# Patient Record
Sex: Female | Born: 1940 | Race: White | Hispanic: No | Smoking: Former smoker
Health system: Southern US, Community
[De-identification: ages and names within clinical notes are randomized; demographics above are authoritative.]

## PROBLEM LIST (undated history)

## (undated) ENCOUNTER — Ambulatory Visit: Admission: EM | Payer: 59 | Source: Home / Self Care

## (undated) DIAGNOSIS — F329 Major depressive disorder, single episode, unspecified: Secondary | ICD-10-CM

## (undated) DIAGNOSIS — N3281 Overactive bladder: Secondary | ICD-10-CM

## (undated) DIAGNOSIS — M1990 Unspecified osteoarthritis, unspecified site: Secondary | ICD-10-CM

## (undated) DIAGNOSIS — N390 Urinary tract infection, site not specified: Secondary | ICD-10-CM

## (undated) DIAGNOSIS — F419 Anxiety disorder, unspecified: Secondary | ICD-10-CM

## (undated) DIAGNOSIS — J45909 Unspecified asthma, uncomplicated: Secondary | ICD-10-CM

## (undated) DIAGNOSIS — F32A Depression, unspecified: Secondary | ICD-10-CM

## (undated) DIAGNOSIS — J449 Chronic obstructive pulmonary disease, unspecified: Secondary | ICD-10-CM

## (undated) DIAGNOSIS — G4700 Insomnia, unspecified: Secondary | ICD-10-CM

## (undated) DIAGNOSIS — L4050 Arthropathic psoriasis, unspecified: Secondary | ICD-10-CM

## (undated) HISTORY — PX: TONSILLECTOMY: SUR1361

## (undated) HISTORY — PX: ABDOMINAL HYSTERECTOMY: SHX81

## (undated) HISTORY — DX: Arthropathic psoriasis, unspecified: L40.50

## (undated) HISTORY — PX: KNEE SURGERY: SHX244

---

## 2009-02-10 ENCOUNTER — Ambulatory Visit: Payer: Self-pay | Admitting: Occupational Medicine

## 2009-07-10 ENCOUNTER — Ambulatory Visit: Payer: Self-pay | Admitting: Family Medicine

## 2012-11-02 ENCOUNTER — Emergency Department (INDEPENDENT_AMBULATORY_CARE_PROVIDER_SITE_OTHER): Payer: Medicare HMO

## 2012-11-02 ENCOUNTER — Emergency Department
Admission: EM | Admit: 2012-11-02 | Discharge: 2012-11-02 | Disposition: A | Discharge status: Home / Self Care | Payer: Medicare HMO | Source: Home / Self Care | Attending: Family Medicine | Admitting: Family Medicine

## 2012-11-02 DIAGNOSIS — N390 Urinary tract infection, site not specified: Secondary | ICD-10-CM

## 2012-11-02 DIAGNOSIS — R0609 Other forms of dyspnea: Secondary | ICD-10-CM

## 2012-11-02 DIAGNOSIS — R0902 Hypoxemia: Secondary | ICD-10-CM

## 2012-11-02 DIAGNOSIS — J45909 Unspecified asthma, uncomplicated: Secondary | ICD-10-CM

## 2012-11-02 DIAGNOSIS — J45901 Unspecified asthma with (acute) exacerbation: Secondary | ICD-10-CM

## 2012-11-02 HISTORY — DX: Unspecified asthma, uncomplicated: J45.909

## 2012-11-02 HISTORY — DX: Insomnia, unspecified: G47.00

## 2012-11-02 HISTORY — DX: Depression, unspecified: F32.A

## 2012-11-02 HISTORY — DX: Anxiety disorder, unspecified: F41.9

## 2012-11-02 HISTORY — DX: Major depressive disorder, single episode, unspecified: F32.9

## 2012-11-02 HISTORY — DX: Unspecified osteoarthritis, unspecified site: M19.90

## 2012-11-02 LAB — POCT URINALYSIS DIP (MANUAL ENTRY)
Glucose, UA: NEGATIVE
Protein Ur, POC: NEGATIVE
Spec Grav, UA: 1.02 (ref 1.005–1.03)
Urobilinogen, UA: 0.2 (ref 0–1)

## 2012-11-02 MED ORDER — METHYLPREDNISOLONE SODIUM SUCC 125 MG IJ SOLR
125.0000 mg | Freq: Once | INTRAMUSCULAR | Status: AC
  Administered 2012-11-02: 125 mg via INTRAMUSCULAR

## 2012-11-02 MED ORDER — IPRATROPIUM-ALBUTEROL 0.5-2.5 (3) MG/3ML IN SOLN
3.0000 mL | RESPIRATORY_TRACT | Status: DC
  Administered 2012-11-02 (×2): 3 mL via RESPIRATORY_TRACT

## 2012-11-02 MED ORDER — PREDNISONE 50 MG PO TABS: ORAL_TABLET | ORAL | Status: DC

## 2012-11-02 MED ORDER — LEVOFLOXACIN 500 MG PO TABS: 500.0000 mg | ORAL_TABLET | Freq: Every day | ORAL | Status: DC

## 2012-11-02 NOTE — ED Notes (Signed)
Symptoms started two days ago w/  Increased frequency

## 2012-11-02 NOTE — ED Provider Notes (Signed)
History     CSN: [id number]  Arrival date & time 11/02/12  1044   First MD Initiated Contact with Patient 11/02/12 1047      Chief Complaint  Patient presents with  . Dysuria   HPI DYSURIA Onset:   2 days  Description: increased urinary frequency and urgency. Similar to UTIs in the past.  Modifying factors: baseline asthmatic. Working to breath currently.   Symptoms Urgency:  yes Frequency: yes  Hesitancy:  yes Hematuria:  no Flank Pain:  no Fever: no Nausea/Vomiting:  no Missed LMP: no STD exposure: no Discharge: no Irritants: no Rash: no  Red Flags   More than 3 UTI's last 12 months:  no PMH of  Diabetes or Immunosuppression:  no Renal Disease/Calculi: no Urinary Tract Abnormality:  no Instrumentation or Trauma: no  Pt also with increased WOB. Pt states that this is a persistent issue. On advair 250/50 for asthma. Non smoker. Has had some mild rhinorrhea, otherwise, no URI sxs.    Past Medical History  Diagnosis Date  . Asthma   . Arthritis   . Insomnia   . Anxiety   . Depression     Past Surgical History  Procedure Date  . Tonsillectomy   . Abdominal hysterectomy   . Knee surgery     History reviewed. No pertinent family history.  History  Substance Use Topics  . Smoking status: Never Smoker   . Smokeless tobacco: Not on file  . Alcohol Use: No    OB History    Grav Para Term Preterm Abortions TAB SAB Ect Mult Living                  Review of Systems  All other systems reviewed and are negative.    Allergies  Chantix and Codeine  Home Medications   Current Outpatient Rx  Name  Route  Sig  Dispense  Refill  . ALPRAZOLAM 0.5 MG PO TABS   Oral   Take 0.5 mg by mouth at bedtime as needed.         Marland Kitchen HYDROCODONE-ACETAMINOPHEN 5-325 MG PO TABS   Oral   Take 1 tablet by mouth every 6 (six) hours as needed.         Marland Kitchen METOCLOPRAMIDE HCL 10 MG PO TABS   Oral   Take 10 mg by mouth 4 (four) times daily.         . TRAZODONE  HCL 150 MG PO TABS   Oral   Take 150 mg by mouth at bedtime.           BP 131/85  Pulse 97  Temp 98.3 F (36.8 C) (Oral)  Resp 24  Ht 5\' 2"  (1.575 m)  Wt 200 lb (90.719 kg)  BMI 36.58 kg/m2  SpO2 92%  Physical Exam  Constitutional:       In chair, increased WOB, unable to speak in full sentences    HENT:  Head: Normocephalic and atraumatic.  Right Ear: External ear normal.  Left Ear: External ear normal.  Eyes: Conjunctivae normal are normal. Pupils are equal, round, and reactive to light.  Neck: Normal range of motion. Neck supple.  Cardiovascular: Normal rate, regular rhythm and normal heart sounds.   Pulmonary/Chest:       Increased WOB, diffuse expiratory wheezes    Abdominal: Soft.       No flank pain  + suprapubic tenderness    Musculoskeletal: Normal range of motion.  Neurological: She is alert.  Skin: Skin is warm.    ED Course  Procedures (including critical care time)  Labs Reviewed - No data to display Dg Chest 2 View  11/02/2012  *RADIOLOGY REPORT*  Clinical Data: Difficulty breathing, asthma, hypoxia  CHEST - 2 VIEW  Comparison: 02/10/2009  Findings: Normal heart size and vascularity.  Scoliosis of the spine noted which distorts the right hilar contour as before.  No focal pneumonia, collapse, consolidation, edema, effusion, or pneumothorax.  Stable sub centimeter calcified granuloma in the left upper lobe.  Healed posterior left fourth rib fracture as before.  Postop changes at the epigastric region.  No significant interval change.  IMPRESSION: Stable chest exam.  No acute process.   Original Report Authenticated By: Judie Petit. Shick, M.D.      1. UTI (lower urinary tract infection)   2. Asthma exacerbation       MDM  Concominant UTI and asthma exacerbation.  Improved aeration s/p duoneb treatment. O2 sats 92%-->98%. Solumedrol 125mg   IM x1  Prednisone x 7 days Will place on levaquin for urinary and resp coverage.  Urine culture.  Infectious,  urinary, and resp red flags discussed at length with pt. Otherwise, follow up as needed.     The patient and/or caregiver has been counseled thoroughly with regard to treatment plan and/or medications prescribed including dosage, schedule, interactions, rationale for use, and possible side effects and they verbalize understanding. Diagnoses and expected course of recovery discussed and will return if not improved as expected or if the condition worsens. Patient and/or caregiver verbalized understanding.             Doree Albee, MD 11/02/12 1225

## 2012-11-03 LAB — URINE CULTURE: Colony Count: 4000

## 2012-11-05 ENCOUNTER — Telehealth: Payer: Self-pay | Admitting: *Deleted

## 2013-04-21 DIAGNOSIS — M810 Age-related osteoporosis without current pathological fracture: Secondary | ICD-10-CM | POA: Insufficient documentation

## 2014-07-10 DIAGNOSIS — Z9889 Other specified postprocedural states: Secondary | ICD-10-CM | POA: Insufficient documentation

## 2017-02-26 DIAGNOSIS — J449 Chronic obstructive pulmonary disease, unspecified: Secondary | ICD-10-CM | POA: Insufficient documentation

## 2017-10-23 HISTORY — PX: BELOW KNEE LEG AMPUTATION: SUR23

## 2018-03-15 ENCOUNTER — Encounter: Payer: Self-pay | Admitting: Emergency Medicine

## 2018-03-15 ENCOUNTER — Emergency Department
Admission: EM | Admit: 2018-03-15 | Discharge: 2018-03-15 | Disposition: A | Discharge status: Home / Self Care | Payer: Medicare HMO | Source: Home / Self Care

## 2018-03-15 DIAGNOSIS — J441 Chronic obstructive pulmonary disease with (acute) exacerbation: Secondary | ICD-10-CM

## 2018-03-15 HISTORY — DX: Chronic obstructive pulmonary disease, unspecified: J44.9

## 2018-03-15 MED ORDER — PREDNISONE 50 MG PO TABS: ORAL_TABLET | ORAL | 0 refills | Status: DC

## 2018-03-15 MED ORDER — METHYLPREDNISOLONE ACETATE 80 MG/ML IJ SUSP
80.0000 mg | Freq: Once | INTRAMUSCULAR | Status: AC
  Administered 2018-03-15: 80 mg via INTRAMUSCULAR

## 2018-03-15 MED ORDER — LEVOFLOXACIN 500 MG PO TABS: 500.0000 mg | ORAL_TABLET | Freq: Every day | ORAL | 0 refills | Status: DC

## 2018-03-15 MED ORDER — IPRATROPIUM-ALBUTEROL 0.5-2.5 (3) MG/3ML IN SOLN
3.0000 mL | Freq: Once | RESPIRATORY_TRACT | Status: AC
  Administered 2018-03-15: 3 mL via RESPIRATORY_TRACT

## 2018-03-15 NOTE — Discharge Instructions (Addendum)
I have sent a prescription to your pharmacy for an antibiotic that you have taken in the past as well as prednisone which is was also taken in the past.  Each of these new prescriptions are to be taken once daily with food.  I am expecting that you will feel better in the next 24 hours.  Continue using your albuterol inhaler every 6 hours as needed.  If you are not improving, please return or go to the emergency room.  I recommend that you follow-up with your personal doctor in 1 week to determine if you need to stay on medications that we prescribed today.

## 2018-03-15 NOTE — ED Provider Notes (Signed)
[facility]    CSN: [id number] Arrival date & time: 03/15/18  1441     History   Chief Complaint Chief Complaint  Patient presents with  . Cough    HPI Alexandra Banks is a 77 y.o. female.   She comes in with a 4-day history of shortness of breath and wheezing.  She is a known COPD patient who quit smoking in 2011.  She does not use home oxygen but does rely on an albuterol inhaler.  She had tried a trilogy inhaler but had adverse effects from this and stopped using it.  She is also taking methotrexate for her psoriasis.  Patient reports that she has never had an exacerbation like this before.  She has had pneumonia in the past however.  She reports no fever, chest pain, nausea or vomiting, or otalgia.  She has had some sinus congestion.  Patient states that the cough is occasionally productive.  She does have some difficulty getting to sleep but once asleep she is able to state comfortable.  Patient's room mate drove her to this facility today  Past Medical History:  Diagnosis Date  . Anxiety   . Arthritis   . Asthma   . COPD (chronic obstructive pulmonary disease) (HCC)   . Depression   . Insomnia     There are no active problems to display for this patient.   Past Surgical History:  Procedure Laterality Date  . ABDOMINAL HYSTERECTOMY    . KNEE SURGERY    . TONSILLECTOMY      OB History   None      Home Medications    Prior to Admission medications   Medication Sig Start Date End Date Taking? Authorizing Provider  ALPRAZolam Prudy Feeler) 0.5 MG tablet Take 0.5 mg by mouth at bedtime as needed.    [provider]  HYDROcodone-acetaminophen (NORCO/VICODIN) 5-325 MG per tablet Take 1 tablet by mouth every 6 (six) hours as needed.    [provider]  levofloxacin (LEVAQUIN) 500 MG tablet Take 1 tablet (500 mg total) by mouth daily. 03/15/18   Elvina Sidle, MD  metoCLOPramide (REGLAN) 10 MG tablet Take 10 mg by mouth 4 (four) times  daily.    [provider]  predniSONE (DELTASONE) 50 MG tablet 1 tab daily x 7 days 03/15/18   Elvina Sidle, MD  traZODone (DESYREL) 150 MG tablet Take 150 mg by mouth at bedtime.    [provider]    Family History History reviewed. No pertinent family history.  Social History Social History   Tobacco Use  . Smoking status: Former Smoker    Types: Cigarettes    Last attempt to quit: 2011    Years since quitting: 8.3  . Smokeless tobacco: Never Used  Substance Use Topics  . Alcohol use: No  . Drug use: No     Allergies   Chantix [varenicline] and Codeine   Review of Systems Review of Systems   Physical Exam Triage Vital Signs ED Triage Vitals  Enc Vitals Group     BP 03/15/18 1505 (!) 141/91     Pulse Rate 03/15/18 1505 68     Resp --      Temp 03/15/18 1505 98.3 F (36.8 C)     Temp Source 03/15/18 1505 Oral     SpO2 03/15/18 1505 95 %     Weight 03/15/18 1506 200 lb (90.7 kg)     Height --      Head Circumference --  Peak Flow --      Pain Score 03/15/18 1506 0     Pain Loc --      Pain Edu? --      Excl. in GC? --    No data found.  Updated Vital Signs BP (!) 141/91   Pulse 68   Temp 98.3 F (36.8 C) (Oral)   Wt 200 lb (90.7 kg)   SpO2 95%   BMI 36.58 kg/m      Physical Exam This is an elderly woman in obvious mild respiratory distress with audible wheezing.  She is overweight but alert and appropriate. HEENT: Unremarkable TMs, edentulous oropharynx with no significant postnasal drainage or airway obstruction, mild nasal passage swelling Chest: Bibasilar rales (few) and bilateral inspiratory and expiratory wheezes with labored breathing. Marked improvement after breathing treatment and Depo-Medrol shot Heart: Regular with no murmur Abdomen: Soft and nontender Extremities: No edema, patient is using a walker  UC Treatments / Results  Labs (all labs ordered are listed, but only abnormal results are displayed) Labs  Reviewed - No data to display  EKG None  Radiology No results found.  Procedures Procedures (including critical care time)  Medications Ordered in UC Medications  ipratropium-albuterol (DUONEB) 0.5-2.5 (3) MG/3ML nebulizer solution 3 mL (has no administration in time range)  methylPREDNISolone acetate (DEPO-MEDROL) injection 80 mg (has no administration in time range)    Initial Impression / Assessment and Plan / UC Course  I have reviewed the triage vital signs and the nursing notes.  Pertinent labs & imaging results that were available during my care of the patient were reviewed by me and considered in my medical decision making (see chart for details).   Final Clinical Impressions(s) / UC Diagnoses   Final diagnoses:  COPD exacerbation (HCC)     Discharge Instructions     I have sent a prescription to your pharmacy for an antibiotic that you have taken in the past as well as prednisone which is was also taken in the past.  Each of these new prescriptions are to be taken once daily with food.  I am expecting that you will feel better in the next 24 hours.  Continue using your albuterol inhaler every 6 hours as needed.  If you are not improving, please return or go to the emergency room.  I recommend that you follow-up with your personal doctor in 1 week to determine if you need to stay on medications that we prescribed today.    ED Prescriptions    Medication Sig Dispense Auth. Provider   levofloxacin (LEVAQUIN) 500 MG tablet Take 1 tablet (500 mg total) by mouth daily. 7 tablet Elvina Sidle, MD   predniSONE (DELTASONE) 50 MG tablet 1 tab daily x 7 days 7 tablet Elvina Sidle, MD       Elvina Sidle, MD 03/15/18 [phone]

## 2018-03-15 NOTE — ED Triage Notes (Signed)
Pt c/o cold sxs x1 week and cough with mucous getting worse. States hx of COPD and frequent pneumonia.

## 2018-03-21 DIAGNOSIS — M14679 Charcot's joint, unspecified ankle and foot: Secondary | ICD-10-CM | POA: Insufficient documentation

## 2018-05-03 ENCOUNTER — Ambulatory Visit: Payer: Medicare HMO | Admitting: Physician Assistant

## 2018-05-30 DIAGNOSIS — Z89512 Acquired absence of left leg below knee: Secondary | ICD-10-CM | POA: Insufficient documentation

## 2019-02-26 DIAGNOSIS — L4050 Arthropathic psoriasis, unspecified: Secondary | ICD-10-CM | POA: Insufficient documentation

## 2019-07-30 ENCOUNTER — Ambulatory Visit (INDEPENDENT_AMBULATORY_CARE_PROVIDER_SITE_OTHER): Payer: Self-pay | Admitting: Osteopathic Medicine

## 2019-07-30 DIAGNOSIS — Z5329 Procedure and treatment not carried out because of patient's decision for other reasons: Secondary | ICD-10-CM

## 2019-07-30 NOTE — Progress Notes (Signed)
Attempted to contact pt at 345 pm, 350 pm and 403 pm. Left a vm msg at [phone] (secondary number). Unable to leave a msg at [phone], phone keeps ringing.

## 2019-09-17 ENCOUNTER — Encounter: Payer: Self-pay | Admitting: Osteopathic Medicine

## 2019-09-17 ENCOUNTER — Ambulatory Visit (INDEPENDENT_AMBULATORY_CARE_PROVIDER_SITE_OTHER): Payer: Medicare Other | Admitting: Osteopathic Medicine

## 2019-09-17 VITALS — BP 136/78 | HR 80 | Temp 97.6°F | Wt 182.0 lb

## 2019-09-17 DIAGNOSIS — J209 Acute bronchitis, unspecified: Secondary | ICD-10-CM

## 2019-09-17 DIAGNOSIS — J440 Chronic obstructive pulmonary disease with acute lower respiratory infection: Secondary | ICD-10-CM | POA: Diagnosis not present

## 2019-09-17 DIAGNOSIS — G2401 Drug induced subacute dyskinesia: Secondary | ICD-10-CM | POA: Diagnosis not present

## 2019-09-17 DIAGNOSIS — H903 Sensorineural hearing loss, bilateral: Secondary | ICD-10-CM | POA: Insufficient documentation

## 2019-09-17 DIAGNOSIS — J069 Acute upper respiratory infection, unspecified: Secondary | ICD-10-CM

## 2019-09-17 MED ORDER — TRAZODONE HCL 150 MG PO TABS: 150.0000 mg | ORAL_TABLET | Freq: Every evening | ORAL | 1 refills | Status: DC | PRN

## 2019-09-17 MED ORDER — AZITHROMYCIN 250 MG PO TABS: ORAL_TABLET | ORAL | 0 refills | Status: DC

## 2019-09-17 MED ORDER — PREDNISONE 20 MG PO TABS: 20.0000 mg | ORAL_TABLET | Freq: Two times a day (BID) | ORAL | 0 refills | Status: DC

## 2019-09-17 NOTE — Progress Notes (Signed)
Virtual Visit via Video (App used: Doximity) Note  I connected with      Alexandra Banks on 09/17/19 at 11:35  by a telemedicine application and verified that I am speaking with the correct person using two identifiers.  Patient is at home I am in office   I discussed the limitations of evaluation and management by telemedicine and the availability of in person appointments. The patient expressed understanding and agreed to proceed.  History of Present Illness: Alexandra Banks is a 78 y.o. female who would like to discuss new to establish, has a cold    Very pleasant lady to establish care.  No major complaints today other than feels like she has had difficulty shaking a cold over the past couple weeks.  Has started to cough up a bit more sputum.  No significant shortness of breath, though patient does report she has COPD this does not really feel like a COPD flare to her, recent negative Covid testing.  As far as other medical history, patient is on methotrexate for psoriasis/psoriatic arthritis, she is status post left BKA for osteomyelitis, she has insomnia for which she takes trazodone as needed, patient has also been getting alprazolam from previous physicians.  Follows with dermatology for her psoriasis, she follows with pulmonology for her COPD.   Patient also reports history of tardive dyskinesia after being treated with Reglan for nausea several years ago.  She has some questions about some commercial she had seen for medications to treat tardive dyskinesia.       Observations/Objective: BP 136/78 (Patient Position: Sitting, Cuff Size: Normal)   Pulse 80   Temp 97.6 F (36.4 C) (Oral)   Wt 182 lb (82.6 kg)   BMI 33.29 kg/m  BP Readings from Last 3 Encounters:  09/17/19 136/78  03/15/18 (!) 141/91  11/02/12 131/85   Exam: Normal Speech.  NAD  Lab and Radiology Results No results found for this or any previous visit (from the past 72 hour(s)). No results  found.     Assessment and Plan: 78 y.o. female with The primary encounter diagnosis was Acute bronchitis with COPD ([place]). Diagnoses of Viral upper respiratory tract infection and Tardive dyskinesia were also pertinent to this visit.   PDMP not reviewed this encounter.   Orders Placed This Encounter  Procedures  . DG Chest 2 View    Order Specific Question:   Reason for Exam (SYMPTOM  OR DIAGNOSIS REQUIRED)    Answer:   cough, hx copd, recent negative COVID (would still take COVID precautions)    Order Specific Question:   Preferred imaging location?    Answer:   [facility]    Order Specific Question:   Radiology Contrast Protocol - do NOT remove file path    Answer:   [email or web address]   Meds ordered this encounter  Medications  . traZODone (DESYREL) 150 MG tablet    Sig: Take 1 tablet (150 mg total) by mouth at bedtime as needed for sleep.    Dispense:  90 tablet    Refill:  1  . azithromycin (ZITHROMAX) 250 MG tablet    Sig: 2 tabs po x1 on Day 1, then 1 tab po daily on Days 2 - 5    Dispense:  6 tablet    Refill:  0  . predniSONE (DELTASONE) 20 MG tablet    Sig: Take 1 tablet (20 mg total) by mouth 2 (two) times daily with a meal.    Dispense:  10 tablet    Refill:  0      Follow Up Instructions: Return for RECHECK PENDING XRAY RESULTS / IF WORSE OR CHANGE.    I discussed the assessment and treatment plan with the patient. The patient was provided an opportunity to ask questions and all were answered. The patient agreed with the plan and demonstrated an understanding of the instructions.   The patient was advised to call back or seek an in-person evaluation if any new concerns, if symptoms worsen or if the condition fails to improve as anticipated.  30 minutes of non-face-to-face time was provided during this  encounter.      . . . . . . . . . . . . . Marland Kitchen                   Historical information moved to improve visibility of documentation.  Past Medical History:  Diagnosis Date  . Anxiety   . Arthritis   . Asthma   . COPD (chronic obstructive pulmonary disease) (HCC)   . Depression   . Insomnia    Past Surgical History:  Procedure Laterality Date  . ABDOMINAL HYSTERECTOMY    . KNEE SURGERY    . TONSILLECTOMY     Social History   Tobacco Use  . Smoking status: Former Smoker    Types: Cigarettes    Quit date: 2011    Years since quitting: 9.9  . Smokeless tobacco: Never Used  Substance Use Topics  . Alcohol use: No   family history is not on file.  Medications: Current Outpatient Medications  Medication Sig Dispense Refill  . albuterol (ACCUNEB) 0.63 MG/3ML nebulizer solution Inhale into the lungs.    Marland Kitchen albuterol (VENTOLIN HFA) 108 (90 Base) MCG/ACT inhaler Inhale into the lungs.    . ALPRAZolam (XANAX) 0.5 MG tablet Take 0.5 mg by mouth at bedtime as needed.    . Fluticasone-Umeclidin-Vilant (TRELEGY ELLIPTA) 100-62.5-25 MCG/INH AEPB Inhale into the lungs.    . folic acid (FOLVITE) 1 MG tablet Take 1 mg by mouth daily.    Marland Kitchen ibuprofen (ADVIL) 200 MG tablet Take by mouth.    . methotrexate (RHEUMATREX) 2.5 MG tablet Take 4 pills by mouth on same day each week    . traZODone (DESYREL) 150 MG tablet Take 1 tablet (150 mg total) by mouth at bedtime as needed for sleep. 90 tablet 1  . triamcinolone cream (KENALOG) 0.1 % Apply daily to rash (do not apply to face)    . azithromycin (ZITHROMAX) 250 MG tablet 2 tabs po x1 on Day 1, then 1 tab po daily on Days 2 - 5 6 tablet 0  . HYDROcodone-acetaminophen (NORCO/VICODIN) 5-325 MG per tablet Take 1 tablet by mouth every 6 (six) hours as needed.    . metoCLOPramide (REGLAN) 10 MG tablet Take 10 mg by mouth 4 (four) times daily.    . predniSONE (DELTASONE) 20 MG tablet Take 1 tablet (20 mg total) by mouth 2  (two) times daily with a meal. 10 tablet 0   No current facility-administered medications for this visit.    Allergies  Allergen Reactions  . Clindamycin/Lincomycin Rash  . Codeine Other (See Comments)    seizure   . Varenicline Rash  . Other Other (See Comments)    Pollen: Watery eyes, runny nose

## 2019-11-04 ENCOUNTER — Telehealth: Payer: Self-pay

## 2019-11-04 MED ORDER — ALPRAZOLAM 0.5 MG PO TABS: 0.5000 mg | ORAL_TABLET | Freq: Every evening | ORAL | 0 refills | Status: DC | PRN

## 2019-11-04 NOTE — Telephone Encounter (Signed)
[facility] pharmacy requesting med refill for alprazolam. Rx written by historical provider.

## 2019-11-04 NOTE — Telephone Encounter (Signed)
Sent!

## 2019-11-06 ENCOUNTER — Ambulatory Visit: Payer: Self-pay | Admitting: Neurology

## 2019-11-12 ENCOUNTER — Ambulatory Visit: Payer: Medicare Other | Admitting: Neurology

## 2019-12-19 ENCOUNTER — Encounter: Payer: Self-pay | Admitting: Osteopathic Medicine

## 2019-12-19 ENCOUNTER — Telehealth: Payer: Self-pay

## 2019-12-19 ENCOUNTER — Encounter: Payer: Medicaid Other | Admitting: Sports Medicine

## 2019-12-19 NOTE — Telephone Encounter (Signed)
Left a vm msg informing patient that form has been completed. Aware form is available to pick up form at front desk. Copy of form sent to scan folder. Direct call back info provided.

## 2020-01-15 ENCOUNTER — Other Ambulatory Visit: Payer: Self-pay | Admitting: Osteopathic Medicine

## 2020-01-15 NOTE — Telephone Encounter (Signed)
Please review for refill- patient at [facility] 

## 2020-01-22 ENCOUNTER — Ambulatory Visit (INDEPENDENT_AMBULATORY_CARE_PROVIDER_SITE_OTHER): Payer: Medicare Other | Admitting: Neurology

## 2020-01-22 ENCOUNTER — Other Ambulatory Visit: Payer: Self-pay

## 2020-01-22 ENCOUNTER — Encounter: Payer: Self-pay | Admitting: Neurology

## 2020-01-22 VITALS — BP 125/72 | HR 68 | Temp 96.9°F | Ht 63.0 in | Wt 190.0 lb

## 2020-01-22 DIAGNOSIS — G2401 Drug induced subacute dyskinesia: Secondary | ICD-10-CM

## 2020-01-22 DIAGNOSIS — R200 Anesthesia of skin: Secondary | ICD-10-CM | POA: Diagnosis not present

## 2020-01-22 DIAGNOSIS — R202 Paresthesia of skin: Secondary | ICD-10-CM | POA: Diagnosis not present

## 2020-01-22 NOTE — Patient Instructions (Addendum)
EMG/NCS  Deutetrabenazine tablets What is this medicine? DEUTETRABENAZINE (DOO tet ra BEN a zeen) is used to treat the involuntary movements caused by tardive dyskinesia or Huntington's disease, also known as Huntington's chorea. This medicine may be used for other purposes; ask your health care provider or pharmacist if you have questions. COMMON BRAND NAME(S): Austedo What should I tell my health care provider before I take this medicine? They need to know if you have any of these conditions:  heart disease  history of breast cancer  history of irregular heartbeat  liver disease  mental illness  suicidal thoughts, plans, or attempt; a previous suicide attempt by you or a family member  an unusual or allergic reaction to deutetrabenazine, tetrabenazine, other medicines, foods, dyes, or preservatives  pregnant or trying to get pregnant  breast-feeding How should I use this medicine? Take this medicine by mouth with a glass of water. Follow the directions on the prescription label. Take this medicine with food. Do not cut, crush, or chew this medicine. Take your medicine at regular intervals. Do not take it more often than directed. Do not stop taking except on your doctor's advice. A special MedGuide will be given to you by the pharmacist with each prescription and refill. Be sure to read this information carefully each time. Talk to your pediatrician regarding the use of this medicine in children. Special care may be needed. Overdosage: If you think you have taken too much of this medicine contact a poison control center or emergency room at once. NOTE: This medicine is only for you. Do not share this medicine with others. What if I miss a dose? If you miss a dose, take it as soon as you can. If it is almost time for your next dose, take only that dose. Do not take double or extra doses. What may interact with this medicine? Do not take this medicine with any of the following  medications:  cisapride  dronedarone  MAOIs like Carbex, Eldepryl, Marplan, Nardil, and Parnate  pimozide  quinidine  reserpine  tetrabenazine  thioridazine  valbenazine This medicine may also interact with the following medications:  antihistamines for allergy, cough and cold  alcohol  certain medicines for depression, anxiety, or psychotic disturbances  certain medicines for sleep  certain medicines for seizures like phenobarbital, primidone  dofetilide  general anesthetics like halothane, isoflurane, methoxyflurane, propofol  local anesthetics like lidocaine, pramoxine, tetracaine  medicines that relax muscles for surgery  narcotic medicines for pain or cough  other medicines that prolong the QT interval (cause an abnormal heart rhythm)  phenothiazines like chlorpromazine, fluphenazine, perphenazine, prochlorperazine, trifluoperazine This list may not describe all possible interactions. Give your health care provider a list of all the medicines, herbs, non-prescription drugs, or dietary supplements you use. Also tell them if you smoke, drink alcohol, or use illegal drugs. Some items may interact with your medicine. What should I watch for while using this medicine? Tell your doctor or healthcare professional if your symptoms do not start to get better or if they get worse. You may get drowsy or dizzy. Do not drive, use machinery, or do anything that needs mental alertness until you know how this medicine affects you. Do not stand or sit up quickly, especially if you are an older patient. This reduces the risk of dizzy or fainting spells. Alcohol may interfere with the effect of this medicine. Avoid alcoholic drinks. Patients and their families should watch out for worsening depression or thoughts of suicide.  If this happens, especially at the beginning of treatment or after a change in dose, call your health care professional. Your mouth may get dry. Chewing  sugarless gum or sucking hard candy, and drinking plenty of water may help. Contact your doctor if the problem does not go away or is severe. Tell your doctor or health care professional right away if you have any change in your eyesight. What side effects may I notice from receiving this medicine? Side effects that you should report to your doctor or health care professional as soon as possible:  allergic reactions like skin rash, itching or hives, swelling of the face, lips, or tongue  changes in vision  feeling faint or lightheaded, falls  loss of balance or coordination  suicidal thoughts or other mood changes  restlessness, pacing, inability to keep still  signs and symptoms of a dangerous change in heartbeat or heart rhythm like chest pain; dizziness; fast or irregular heartbeat; palpitations; feeling faint or lightheaded, falls; breathing problems  signs and symptoms of neuroleptic malignant syndrome (NMS) such as confusion; fast or irregular heartbeat; high fever; increased sweating; uncontrolled head, mouth, neck, arm, or leg movements; stiff muscles  tremors Side effects that usually do not require medical attention (report these to your doctor or health care professional if they continue or are bothersome):  anxious  constipation  diarrhea  dizziness  dry mouth  tiredness  trouble sleeping This list may not describe all possible side effects. Call your doctor for medical advice about side effects. You may report side effects to FDA at 1-800-FDA-1088. Where should I keep my medicine? Keep out of the reach of children. Store at room temperature between 15 and 30 degrees C (59 and 86 degrees F). Throw away any unused medicine after the expiration date. NOTE: This sheet is a summary. It may not cover all possible information. If you have questions about this medicine, talk to your doctor, pharmacist, or health care provider.  2020 Elsevier/Gold Standard (2018-09-30  10:02:54)  Electromyoneurogram Electromyoneurogram is a test to check how well your muscles and nerves are working. This procedure includes the combined use of electromyogram (EMG) and nerve conduction study (NCS). EMG is used to look for muscular disorders. NCS, which is also called electroneurogram, measures how well your nerves are controlling your muscles. The procedures are usually done together to check if your muscles and nerves are healthy. If the results of the tests are abnormal, this may indicate disease or injury, such as a neuromuscular disease or peripheral nerve damage. Tell a health care provider about:  Any allergies you have.  All medicines you are taking, including vitamins, herbs, eye drops, creams, and over-the-counter medicines.  Any problems you or family members have had with anesthetic medicines.  Any blood disorders you have.  Any surgeries you have had.  Any medical conditions you have.  If you have a pacemaker.  Whether you are pregnant or may be pregnant. What are the risks? Generally, this is a safe procedure. However, problems may occur, including:  Infection where the electrodes were inserted.  Bleeding. What happens before the procedure? Medicines Ask your health care provider about:  Changing or stopping your regular medicines. This is especially important if you are taking diabetes medicines or blood thinners.  Taking medicines such as aspirin and ibuprofen. These medicines can thin your blood. Do not take these medicines unless your health care provider tells you to take them.  Taking over-the-counter medicines, vitamins, herbs, and supplements. General  instructions  Your health care provider may ask you to avoid: ? Beverages that have caffeine, such as coffee and tea. ? Any products that contain nicotine or tobacco. These products include cigarettes, e-cigarettes, and chewing tobacco. If you need help quitting, ask your health care  provider.  Do not use lotions or creams on the same day that you will be having the procedure. What happens during the procedure? For EMG   Your health care provider will ask you to stay in a position so that he or she can access the muscle that will be studied. You may be standing, sitting, or lying down.  You may be given a medicine that numbs the area (local anesthetic).  A very thin needle that has an electrode will be inserted into your muscle.  Another small electrode will be placed on your skin near the muscle.  Your health care provider will ask you to continue to remain still.  The electrodes will send a signal that tells about the electrical activity of your muscles. You may see this on a monitor or hear it in the room.  After your muscles have been studied at rest, your health care provider will ask you to contract or flex your muscles. The electrodes will send a signal that tells about the electrical activity of your muscles.  Your health care provider will remove the electrodes and the electrode needles when the procedure is finished. The procedure may vary among health care providers and hospitals. For NCS   An electrode that records your nerve activity (recording electrode) will be placed on your skin by the muscle that is being studied.  An electrode that is used as a reference (reference electrode) will be placed near the recording electrode.  A paste or gel will be applied to your skin between the recording electrode and the reference electrode.  Your nerve will be stimulated with a mild shock. Your health care provider will measure how much time it takes for your muscle to react.  Your health care provider will remove the electrodes and the gel when the procedure is finished. The procedure may vary among health care providers and hospitals. What happens after the procedure?  It is up to you to get the results of your procedure. Ask your health care provider,  or the department that is doing the procedure, when your results will be ready.  Your health care provider may: ? Give you medicines for any pain. ? Monitor the insertion sites to make sure that bleeding stops. Summary  Electromyoneurogram is a test to check how well your muscles and nerves are working.  If the results of the tests are abnormal, this may indicate disease or injury.  This is a safe procedure. However, problems may occur, such as bleeding and infection.  Your health care provider will do two tests to complete this procedure. One checks your muscles (EMG) and another checks your nerves (NCS).  It is up to you to get the results of your procedure. Ask your health care provider, or the department that is doing the procedure, when your results will be ready. This information is not intended to replace advice given to you by your health care provider. Make sure you discuss any questions you have with your health care provider. Document Revised: 06/25/2018 Document Reviewed: 06/07/2018 Elsevier Patient Education  [place].    Tardive Dyskinesia Tardive dyskinesia is a disorder that causes uncontrollable body movements. It occurs in some people who are  taking certain medicines to treat a mental illness (neuroleptic medicine) or have taken this type of medicine in the past. These medicines block the effects of a specific brain chemical (dopamine). Sometimes, tardive dyskinesia starts months or years after someone took the medicine. Not everyone who takes a neuroleptic medicine will get tardive dyskinesia. What are the causes? This condition is caused by changes in your brain that are associated with taking a neuroleptic medicine. What increases the risk? If you are taking a neuroleptic medicine, your risk for tardive dyskinesia may be higher if you:  Are taking an older type of neuroleptic medicine.  Have been taking the medicine for a long time at a high dose.  Are  a woman past the age of menopause.  Are older than 60.  Have a history of alcohol or drug abuse. What are the signs or symptoms? Abnormal, uncontrollable movements are the main symptom of tardive dyskinesia. These types of movements may include:  Grimacing.  Sticking out or twisting your tongue.  Making chewing or sucking sounds.  Making grunting or sighing noises.  Blinking your eyes.  Twisting, swaying, or thrusting your body.  Foot tapping or finger waving.  Rapid movements of your arms or legs. How is this diagnosed? Your health care provider may suspect that you have tardive dyskinesia if:  You have been taking neuroleptic medicines.  You have abnormal movements that you cannot control. If you are taking a medicine that can cause tardive dyskinesia, your health care provider may screen you for early signs of the condition. This may include:  Observing your body movements.  Using a specific rating scale called the Abnormal Involuntary Movement Scale (AIMS). You may also have tests to rule out other conditions that cause abnormal body movements, including:  Parkinson's disease.  Huntington's disease.  Stroke. How is this treated? The best treatment for tardive dyskinesia is to lower the dose of your medicine or to switch to a different medicine at the first sign of abnormal and uncontrolled movements. There is no cure for long-term (chronic) tardive dyskinesia. Some medicines may help control the movements. These include:  Clozapine, a medicine used to treat mental illness (antipsychotic).  Some muscle relaxants.  Some anti-seizure medicines.  Some medicines used to treat high blood pressure.  Some tranquilizers (sedatives). Follow these instructions at home:      Take over-the-counter and prescription medicines only as told by your health care provider. Do not stop or start taking any medicines without talking to your health care provider first.  Do not  abuse drugs or alcohol.  Keep all follow-up visits as told by your health care provider. This is important. Contact a health care provider if:  You are unable to eat or drink.  You have had a fall.  Your symptoms get worse. Summary  Tardive dyskinesia is a disorder that causes uncontrollable body movements. These may include grimacing, sticking out or twisting your tongue, blinking your eyes, or rapid movements of your arms or legs.  The condition occurs in some people who are taking certain medicines to treat a mental illness (neuroleptic medicine) or have taken this type of medicine in the past.  The best treatment for tardive dyskinesia is to lower the dose of your medicine or to switch to a different medicine at the first sign of abnormal and uncontrolled movements.  There is no cure for long-term (chronic) tardive dyskinesia, but some medicines may help control the movements. This information is not intended to replace  advice given to you by your health care provider. Make sure you discuss any questions you have with your health care provider. Document Revised: 11/01/2017 Document Reviewed: 11/01/2017 Elsevier Patient Education  2020 [employer]

## 2020-01-22 NOTE — Progress Notes (Signed)
[facility]    Provider:  Dr Lucia Gaskins Requesting Provider: Sunnie Nielsen, DO Primary Care Provider:  Sunnie Nielsen, DO  CC:  Tardive Dyskinesias  HPI:  Alexandra Banks is a 79 y.o. female here as requested by Sunnie Nielsen, DO for Tardive Dyskinesias(TD). PMHx insomnia,depressio, COPD, asthma, arthritis, anxiety.  I reviewed Dr. 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  She has orofacial dyskinesias. They have not improved. 30 years ago she had a stomach ulcer and she was given reglan for stomach ulcer, she took it 3-4 months consistently, several years later a doctor noticed she had tardive dyskenias. She feels it is all the time, she feels it is her tongue, she occasionally smacks lips, she drools quite a bit at night but may not be related. Patient does not really find it disturbing and generally it does not bother her or affect her with eating. It bothers her roommate. She denies any other areas of tardive Dyskinesias. She would prefer not to have side effects, not worsening, stable, and mild.   She also has worsening symptoms of the right hand, worsening in the last year, numbness and tingling, also weakness, all the fingers, bothers her more at night and in the morning due to numbness. Some neck pain and arthritis but no radicular symptoms.   Reviewed notes, labs and imaging from outside physicians, which showed:  Review of his records show he has taken Reglan in the past (can cause TD)  Review of Systems: Patient complains of symptoms per HPI as well as the following symptoms: numbness, tingling, weakness. Pertinent negatives and positives per HPI. All others negative.   Social History    Socioeconomic History  . Marital status: Divorced    Spouse name: Not on file  . Number of children: 3  . Years of education: Not on file  . Highest education level: Master's degree (e.g., MA, MS, MEng, MEd, MSW, MBA)  Occupational History  . Not on file  Tobacco Use  . Smoking status: Former Smoker    Types: Cigarettes    Quit date: 2011    Years since quitting: 10.2  . Smokeless tobacco: Never Used  Substance and Sexual Activity  . Alcohol use: No  . Drug use: No  . Sexual activity: Not Currently  Other Topics Concern  . Not on file  Social History Narrative   Lives with [name] (friend) and [name]'s foster child named [name]    Right handed   Caffeine: "probably 2 cups of coffee in the morning"   Social Determinants of Health   Financial Resource Strain:   . Difficulty of Paying Living Expenses:   Food Insecurity:   . Worried About Programme researcher, broadcasting/film/video in the Last Year:   . Barista in the Last Year:   Transportation Needs:   . Freight forwarder (Medical):   Marland Kitchen Lack of Transportation (Non-Medical):   Physical Activity:   . Days of Exercise per Week:   . Minutes of Exercise per Session:   Stress:   . Feeling of Stress :   Social Connections:   . Frequency of Communication with Friends and Family:   . Frequency of Social Gatherings with Friends and Family:   . Attends Religious Services:   .  Active Member of Clubs or Organizations:   . Attends Archivist Meetings:   Marland Kitchen Marital Status:   Intimate Partner Violence:   . Fear of Current or Ex-Partner:   . Emotionally Abused:   Marland Kitchen Physically Abused:   . Sexually Abused:     Family History  Problem Relation Age of Onset  . Psoriasis Father   . Other Father        psoriatic arthritis  . Movement disorder Neg Hx     Past Medical History:  Diagnosis Date  . Anxiety   . Arthritis   . Asthma   . COPD (chronic obstructive pulmonary disease) ([place])   . Depression    pt states she doesn't take  anything for this anymore   . Insomnia   . Psoriatic arthritis [facility])     Patient Active Problem List   Diagnosis Date Noted  . 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 Psoriatic arthritis ([place]) 02/26/2019  . S/P BKA (below knee amputation) unilateral, left ([place]) 05/30/2018  . COPD, severe ([place]) 02/26/2017  . Status post left foot surgery 07/10/2014  . Age related osteoporosis 04/21/2013  . Spinal stenosis of lumbar region 12/12/2011  . Status post total abdominal hysterectomy and bilateral salpingo-oophorectomy 12/12/2011  . Recurrent major depressive disorder, in full remission ([place]) 12/12/2011    Past Surgical History:  Procedure Laterality Date  . ABDOMINAL HYSTERECTOMY    . BELOW KNEE LEG AMPUTATION Left 2019  . KNEE SURGERY    . TONSILLECTOMY      Current Outpatient Medications  Medication Sig Dispense Refill  . albuterol (ACCUNEB) 0.63 MG/3ML nebulizer solution Inhale into the lungs.    Marland Kitchen albuterol (VENTOLIN HFA) 108 (90 Base) MCG/ACT inhaler Inhale into the lungs.    . ALPRAZolam (XANAX) 0.5 MG tablet Take 1 tablet (0.5 mg total) by mouth at bedtime as needed. 90 tablet 0  . Fluticasone-Umeclidin-Vilant (TRELEGY ELLIPTA) 100-62.5-25 MCG/INH AEPB Inhale into the lungs.    . folic acid (FOLVITE) 1 MG tablet Take 1 mg by mouth daily.    Marland Kitchen ibuprofen (ADVIL) 200 MG tablet Take by mouth.    . methotrexate (RHEUMATREX) 2.5 MG tablet Take 4 pills by mouth on same day each week    . traZODone (DESYREL) 150 MG tablet Take 1 tablet (150 mg total) by mouth at bedtime as needed for sleep. 90 tablet 1  . triamcinolone cream (KENALOG) 0.1 % Apply daily to rash (do not apply to face)     No current facility-administered medications for this visit.    Allergies as of 01/22/2020 - Review Complete 01/22/2020  Allergen Reaction Noted  . Clindamycin/lincomycin Rash 06/23/2014  . Codeine Other (See Comments) 06/26/2011  . Varenicline Rash 06/26/2011  . Other Other  (See Comments) 03/02/2014    Vitals: BP 125/72 (BP Location: Right Arm, Patient Position: Sitting, Cuff Size: Large)   Pulse 68   Temp (!) 96.9 F (36.1 C) Comment: taken at front  Ht 5\' 3"  (1.6 m)   Wt 190 lb (86.2 kg)   BMI 33.66 kg/m  Last Weight:  Wt Readings from Last 1 Encounters:  01/22/20 190 lb (86.2 kg)   Last Height:   Ht Readings from Last 1 Encounters:  01/22/20 5\' 3"  (1.6 m)     Physical exam: Exam: Gen: NAD, conversant, well nourised, obese, well groomed                     CV: RRR, no  MRG. No Carotid Bruits. +peripheral edema right lower, warm, nontender Eyes: Conjunctivae clear without exudates or hemorrhage  Neuro: Detailed Neurologic Exam  Speech:    Speech is normal; fluent and spontaneous with normal comprehension.  Cognition:    The patient is oriented to person, place, and time;     recent and remote memory intact;     language fluent;     normal attention, concentration,     fund of knowledge Cranial Nerves:    The pupils are equal, round, and reactive to light. Pupils too small to visualize fundi. Visual fields are full to finger confrontation. Extraocular movements are intact. Trigeminal sensation is intact and the muscles of mastication are normal. The face is symmetric. The palate elevates in the midline. Hearing intact. Voice is normal. Shoulder shrug is normal. The tongue has normal motion without fasciculations.   Coordination:    No dysmetria   Gait:    Antalgic, left BKA, uses walker  Motor Observation:    Tongue darting, very minimal orofacial dyskinesias, distal atrophy in the hands more pronounced FDI Tone:    Normal muscle tone.    Posture:    Posture is normal. normal erect    Strength: left arm 4/5 weaker proximally she has a torn rotater cuff, chronic. Otherwise strength is V/V in the upper and lower limbs (left BKA)     Sensation: intact to LT     Reflex Exam:  DTR's:    Deep tendon reflexes in the upper and  lower extremities are normal bilaterally (BKA left) Toes:    The toes are downgoing right foot Clonus:    Clonus is absent. Right leg    Assessment/Plan:   79 y.o. female here as requested by Sunnie Nielsen, DO for Tardive Dyskinesias(TD). PMHx insomnia,depressio, COPD, asthma, arthritis, anxiety.  Patient is very lovely, we had a long talk about tardive dyskinesias, there is no treatment however there are medications such as tetrabenazine, valbenazine, and the new wrist medication deutetrabenazine(Austudo).  The tardive dyskinesias are very mild and they are not causing any anxiety or any significant problems for the patient, at this point I recommend no treatment and she agrees.  We also talked about the symptoms in her right hand which I suspect is carpal tunnel syndrome, we will schedule her for EMG nerve conduction study.  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 NCV with EMG(electromyography)    Cc: Sunnie Nielsen, DO  Naomie Dean, MD  [facility] [address] [place], [place] [address]  Phone [phone] Fax [phone]

## 2020-01-28 ENCOUNTER — Ambulatory Visit: Payer: Medicaid Other | Admitting: Osteopathic Medicine

## 2020-02-05 ENCOUNTER — Encounter: Payer: Medicare Other | Admitting: Neurology

## 2020-02-11 ENCOUNTER — Ambulatory Visit (INDEPENDENT_AMBULATORY_CARE_PROVIDER_SITE_OTHER): Payer: Medicare Other | Admitting: Osteopathic Medicine

## 2020-02-11 ENCOUNTER — Other Ambulatory Visit: Payer: Self-pay

## 2020-02-11 ENCOUNTER — Encounter: Payer: Self-pay | Admitting: Osteopathic Medicine

## 2020-02-11 VITALS — BP 121/74 | HR 83 | Temp 97.8°F | Wt 185.0 lb

## 2020-02-11 DIAGNOSIS — F418 Other specified anxiety disorders: Secondary | ICD-10-CM

## 2020-02-11 DIAGNOSIS — L4050 Arthropathic psoriasis, unspecified: Secondary | ICD-10-CM | POA: Diagnosis not present

## 2020-02-11 DIAGNOSIS — G4700 Insomnia, unspecified: Secondary | ICD-10-CM

## 2020-02-11 DIAGNOSIS — J449 Chronic obstructive pulmonary disease, unspecified: Secondary | ICD-10-CM

## 2020-02-11 DIAGNOSIS — Z5181 Encounter for therapeutic drug level monitoring: Secondary | ICD-10-CM

## 2020-02-11 LAB — CBC WITH DIFFERENTIAL/PLATELET
Absolute Monocytes: 604 cells/uL (ref 200–950)
HCT: 39.5 % (ref 35.0–45.0)
Hemoglobin: 12.8 g/dL (ref 11.7–15.5)
Lymphs Abs: 1031 cells/uL (ref 850–3900)
MCH: 30.1 pg (ref 27.0–33.0)
MCV: 92.9 fL (ref 80.0–100.0)
MPV: 10.6 fL (ref 7.5–12.5)
Monocytes Relative: 9.9 %
Neutro Abs: 3983 cells/uL (ref 1500–7800)
RBC: 4.25 10*6/uL (ref 3.80–5.10)
RDW: 14.5 % (ref 11.0–15.0)

## 2020-02-11 LAB — COMPLETE METABOLIC PANEL WITH GFR
AG Ratio: 1.6 (calc) (ref 1.0–2.5)
ALT: 11 U/L (ref 6–29)
Albumin: 3.9 g/dL (ref 3.6–5.1)
Chloride: 104 mmol/L (ref 98–110)
Creat: 1.05 mg/dL — ABNORMAL HIGH (ref 0.60–0.93)
GFR, Est African American: 60 mL/min/{1.73_m2} (ref >=60)
Globulin: 2.5 g/dL (calc) (ref 1.9–3.7)

## 2020-02-11 MED ORDER — ALPRAZOLAM 0.5 MG PO TABS: 0.2500 mg | ORAL_TABLET | Freq: Two times a day (BID) | ORAL | 1 refills | Status: DC | PRN

## 2020-02-11 MED ORDER — TRAZODONE HCL 150 MG PO TABS: 150.0000 mg | ORAL_TABLET | Freq: Every evening | ORAL | 1 refills | Status: DC | PRN

## 2020-02-11 NOTE — Patient Instructions (Addendum)
Ask pharmacist about COVID vaccine!

## 2020-02-11 NOTE — Progress Notes (Signed)
Alexandra Banks is a 79 y.o. female who presents to  [facility] at [facility]  today, 02/11/20, seeking care for the following: . Refill medications - stable anxiety/insomnia and psoriasis (seeing derm)  . Needs labs . Otherwise doing well, no concerns!      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 Diagnoses of Anxiety with depression, Insomnia, unspecified type, Psoriatic arthritis (HCC), and Medication monitoring encounter were also pertinent to this visit.   Patient Instructions  Ask pharmacist about COVID vaccine!         Orders Placed This Encounter  Procedures  . CBC with Differential/Platelet  . COMPLETE METABOLIC PANEL WITH GFR    Meds ordered this encounter  Medications  . ALPRAZolam (XANAX) 0.5 MG tablet    Sig: Take 0.5-1 tablets (0.25-0.5 mg total) by mouth 2 (two) times daily as needed (#90 for 90 days!).    Dispense:  90 tablet    Refill:  1  . traZODone (DESYREL) 150 MG tablet    Sig: Take 1 tablet (150 mg total) by mouth at bedtime as needed for sleep.    Dispense:  90 tablet    Refill:  1       Follow-up instructions: Return in about 6 months (around 08/12/2020) for ANNUAL / MEDICARE WELLNESS W/ DR Lyn Hollingshead (call week prior to visit for lab orders).                                         BP 121/74 (BP Location: Left Arm, Patient Position: Sitting, Cuff Size: Large)   Pulse 83   Temp 97.8 F (36.6 C) (Oral)   Wt 185 lb (83.9 kg)   BMI 32.77 kg/m   Current Meds  Medication Sig  . albuterol (ACCUNEB) 0.63 MG/3ML nebulizer solution Inhale into the lungs.  Marland Kitchen albuterol (VENTOLIN HFA) 108 (90 Base) MCG/ACT inhaler Inhale into the lungs.  . ALPRAZolam (XANAX) 0.5 MG tablet Take 0.5-1 tablets (0.25-0.5 mg total) by mouth 2 (two) times daily as needed (#90 for 90 days!).  Marland Kitchen  Fluticasone-Umeclidin-Vilant (TRELEGY ELLIPTA) 100-62.5-25 MCG/INH AEPB Inhale into the lungs.  . folic acid (FOLVITE) 1 MG tablet Take 1 mg by mouth daily.  Marland Kitchen ibuprofen (ADVIL) 200 MG tablet Take by mouth.  . methotrexate (RHEUMATREX) 2.5 MG tablet Take 4 pills by mouth on same day each week  . traZODone (DESYREL) 150 MG tablet Take 1 tablet (150 mg total) by mouth at bedtime as needed for sleep.  Marland Kitchen triamcinolone cream (KENALOG) 0.1 % Apply daily to rash (do not apply to face)  . [DISCONTINUED] ALPRAZolam (XANAX) 0.5 MG tablet Take 1 tablet (0.5 mg total) by mouth at bedtime as needed.  . [DISCONTINUED] traZODone (DESYREL) 150 MG tablet Take 1 tablet (150 mg total) by mouth at bedtime as needed for sleep.    No results found for this or any previous visit (from the past 72 hour(s)).  No results found.  Depression screen [facility] 2/9 09/17/2019  Decreased Interest 0  Down, Depressed, Hopeless 0  PHQ - 2 Score 0  Altered sleeping 0  Tired, decreased energy 0  Change in appetite 0  Feeling bad or failure about yourself  0  Trouble concentrating 0  Moving slowly or fidgety/restless 0  Suicidal thoughts 0  PHQ-9 Score 0  GAD 7 : Generalized Anxiety Score 09/17/2019  Nervous, Anxious, on Edge 0  Control/stop worrying 0  Worry too much - different things 0  Trouble relaxing 0  Restless 0  Easily annoyed or irritable 0  Afraid - awful might happen 0  Total GAD 7 Score 0      All questions at time of visit were answered - patient instructed to contact office with any additional concerns or updates.  ER/RTC precautions were reviewed with the patient.  Please note: voice recognition software was used to produce this document, and typos may escape review. Please contact Dr. Sheppard Coil for any needed clarifications.

## 2020-02-16 LAB — COMPLETE METABOLIC PANEL WITH GFR
AST: 17 U/L (ref 10–35)
Alkaline phosphatase (APISO): 106 U/L (ref 37–153)
BUN/Creatinine Ratio: 20 (calc) (ref 6–22)
BUN: 21 mg/dL (ref 7–25)
CO2: 29 mmol/L (ref 20–32)
Calcium: 10 mg/dL (ref 8.6–10.4)
GFR, Est Non African American: 52 mL/min/{1.73_m2} — ABNORMAL LOW (ref >=60)
Glucose, Bld: 101 mg/dL — ABNORMAL HIGH (ref 65–99)
Potassium: 4.9 mmol/L (ref 3.5–5.3)
Sodium: 141 mmol/L (ref 135–146)
Total Bilirubin: 0.3 mg/dL (ref 0.2–1.2)
Total Protein: 6.4 g/dL (ref 6.1–8.1)

## 2020-02-16 LAB — CBC WITH DIFFERENTIAL/PLATELET
Basophils Absolute: 73 cells/uL (ref 0–200)
Basophils Relative: 1.2 %
Eosinophils Absolute: 409 cells/uL (ref 15–500)
Eosinophils Relative: 6.7 %
MCHC: 32.4 g/dL (ref 32.0–36.0)
Neutrophils Relative %: 65.3 %
Platelets: 193 10*3/uL (ref 140–400)
Total Lymphocyte: 16.9 %
WBC: 6.1 10*3/uL (ref 3.8–10.8)

## 2020-02-16 LAB — HEMOGLOBIN A1C W/OUT EAG: Hgb A1c MFr Bld: 5.1 % of total Hgb (ref <5.7)

## 2020-04-23 ENCOUNTER — Encounter: Payer: Self-pay | Admitting: Medical-Surgical

## 2020-04-23 ENCOUNTER — Ambulatory Visit (INDEPENDENT_AMBULATORY_CARE_PROVIDER_SITE_OTHER): Payer: Medicare Other | Admitting: Medical-Surgical

## 2020-04-23 VITALS — BP 126/81 | HR 81 | Temp 98.3°F

## 2020-04-23 DIAGNOSIS — H6123 Impacted cerumen, bilateral: Secondary | ICD-10-CM | POA: Diagnosis not present

## 2020-04-23 NOTE — Progress Notes (Signed)
Subjective:    CC: bilateral ear wax impaction  HPI: Pleasant 79 year old female presenting today for bilateral ear wax impaction. She went to the [facility] office today regarding her hearing aids and was told by them that both her ears were clogged with ear wax. She does wear bilateral hearing aids but notes her right one is broken. Baseline hearing loss, not worse at this time. Denies ringing in the ear, pain, or itching. No previous problems with ear wax overproduction or impaction. Uses her finger to clean the external ear and ear opening. Denies using q-tips. Never used ear drops for wax softening.   I reviewed the past medical history, family history, social history, surgical history, and allergies today and no changes were needed.  Please see the problem list section below in epic for further details.  Past Medical History: Past Medical History:  Diagnosis Date  . Anxiety   . Arthritis   . Asthma   . COPD (chronic obstructive pulmonary disease) (HCC)   . Depression    pt states she doesn't take anything for this anymore   . Insomnia   . Psoriatic arthritis [facility])    Past Surgical History: Past Surgical History:  Procedure Laterality Date  . ABDOMINAL HYSTERECTOMY    . BELOW KNEE LEG AMPUTATION Left 2019  . KNEE SURGERY    . TONSILLECTOMY     Social History: Social History   Socioeconomic History  . Marital status: Divorced    Spouse name: Not on file  . Number of children: 3  . Years of education: Not on file  . Highest education level: Master's degree (e.g., MA, MS, MEng, MEd, MSW, MBA)  Occupational History  . Not on file  Tobacco Use  . Smoking status: Former Smoker    Types: Cigarettes    Quit date: 2011    Years since quitting: 10.5  . Smokeless tobacco: Never Used  Vaping Use  . Vaping Use: Never used  Substance and Sexual Activity  . Alcohol use: No  . Drug use: No  . Sexual activity: Not Currently  Other Topics Concern  . Not on file  Social History  Narrative   Lives with [name] (friend) and [name]'s foster child named Faith    Right handed   Caffeine: "probably 2 cups of coffee in the morning"   Social Determinants of Health   Financial Resource Strain:   . Difficulty of Paying Living Expenses:   Food Insecurity:   . Worried About Programme researcher, broadcasting/film/video in the Last Year:   . Barista in the Last Year:   Transportation Needs:   . Freight forwarder (Medical):   Marland Kitchen Lack of Transportation (Non-Medical):   Physical Activity:   . Days of Exercise per Week:   . Minutes of Exercise per Session:   Stress:   . Feeling of Stress :   Social Connections:   . Frequency of Communication with Friends and Family:   . Frequency of Social Gatherings with Friends and Family:   . Attends Religious Services:   . Active Member of Clubs or Organizations:   . Attends Banker Meetings:   Marland Kitchen Marital Status:    Family History: Family History  Problem Relation Age of Onset  . Psoriasis Father   . Other Father        psoriatic arthritis  . Movement disorder Neg Hx    Allergies: Allergies  Allergen Reactions  . Clindamycin/Lincomycin Rash  . Codeine Other (See  Comments)    seizure   . Varenicline Rash  . Other Other (See Comments)    Pollen: Watery eyes, runny nose   Medications: See med rec.  Review of Systems: See HPI for pertinent positives and negatives.   Objective:    General: Well Developed, well nourished, and in no acute distress.  Neuro: Alert and oriented x3.  HEENT: Normocephalic, atraumatic. Bilateral external ear canals blocked by dark yellow/brown cerumen. After irrigation, external ear canals patent, TMs visible and normal. Skin: Warm and dry. Cardiac: Regular rate and rhythm, no murmurs rubs or gallops, no lower extremity edema.  Respiratory: Clear to auscultation bilaterally. Not using accessory muscles, speaking in full sentences.   Impression and Recommendations:    1. Bilateral impacted  cerumen Bilateral ear irrigation completed in office. Discussed cerumen impaction prevention measures. Information provided on AVS. Recommend using wax softening drops as needed. May also use mineral oil soaked cotton balls placed shallowly in the ear for 10 minutes once weekly to soften wax and facilitate removal.   Return if symptoms worsen or fail to improve.  ___________________________________________ Thayer Ohm, DNP, APRN, FNP-BC Primary Care and Sports Medicine [facility] [place]

## 2020-04-23 NOTE — Patient Instructions (Signed)
Ok to soak 2 cotton balls in mineral oil and place one shallowly in each ear for 10 minutes once a week to help soften ear wax for easier removal  Earwax Buildup, Adult The ears produce a substance called earwax that helps keep bacteria out of the ear and protects the skin in the ear canal. Occasionally, earwax can build up in the ear and cause discomfort or hearing loss. What increases the risk? This condition is more likely to develop in people who:  Are female.  Are elderly.  Naturally produce more earwax.  Clean their ears often with cotton swabs.  Use earplugs often.  Use in-ear headphones often.  Wear hearing aids.  Have narrow ear canals.  Have earwax that is overly thick or sticky.  Have eczema.  Are dehydrated.  Have excess hair in the ear canal. What are the signs or symptoms? Symptoms of this condition include:  Reduced or muffled hearing.  A feeling of fullness in the ear or feeling that the ear is plugged.  Fluid coming from the ear.  Ear pain.  Ear itch.  Ringing in the ear.  Coughing.  An obvious piece of earwax that can be seen inside the ear canal. How is this diagnosed? This condition may be diagnosed based on:  Your symptoms.  Your medical history.  An ear exam. During the exam, your health care provider will look into your ear with an instrument called an otoscope. You may have tests, including a hearing test. How is this treated? This condition may be treated by:  Using ear drops to soften the earwax.  Having the earwax removed by a health care provider. The health care provider may: ? Flush the ear with water. ? Use an instrument that has a loop on the end (curette). ? Use a suction device.  Surgery to remove the wax buildup. This may be done in severe cases. Follow these instructions at home:   Take over-the-counter and prescription medicines only as told by your health care provider.  Do not put any objects, including  cotton swabs, into your ear. You can clean the opening of your ear canal with a washcloth or facial tissue.  Follow instructions from your health care provider about cleaning your ears. Do not over-clean your ears.  Drink enough fluid to keep your urine clear or pale yellow. This will help to thin the earwax.  Keep all follow-up visits as told by your health care provider. If earwax builds up in your ears often or if you use hearing aids, consider seeing your health care provider for routine, preventive ear cleanings. Ask your health care provider how often you should schedule your cleanings.  If you have hearing aids, clean them according to instructions from the manufacturer and your health care provider. Contact a health care provider if:  You have ear pain.  You develop a fever.  You have blood, pus, or other fluid coming from your ear.  You have hearing loss.  You have ringing in your ears that does not go away.  Your symptoms do not improve with treatment.  You feel like the room is spinning (vertigo). Summary  Earwax can build up in the ear and cause discomfort or hearing loss.  The most common symptoms of this condition include reduced or muffled hearing and a feeling of fullness in the ear or feeling that the ear is plugged.  This condition may be diagnosed based on your symptoms, your medical history, and an ear  exam.  This condition may be treated by using ear drops to soften the earwax or by having the earwax removed by a health care provider.  Do not put any objects, including cotton swabs, into your ear. You can clean the opening of your ear canal with a washcloth or facial tissue. This information is not intended to replace advice given to you by your health care provider. Make sure you discuss any questions you have with your health care provider. Document Revised: 09/21/2017 Document Reviewed: 12/20/2016 Elsevier Patient Education  2020 [employer].

## 2020-05-31 ENCOUNTER — Telehealth: Payer: Self-pay

## 2020-05-31 NOTE — Telephone Encounter (Signed)
Kalan asked if she could get the Covid vaccine just after taking a Humira injection. I advised she will need to call the office of the prescribing provider to find out if there are any recommendations with the Covid vaccine and Humira.   Our providers are recommending the vaccine to all our patients, especially if you are a patient who has medical conditions that make you high-risk for serious COVID disease.

## 2020-06-17 ENCOUNTER — Telehealth (INDEPENDENT_AMBULATORY_CARE_PROVIDER_SITE_OTHER): Payer: Medicare Other | Admitting: Family Medicine

## 2020-06-17 ENCOUNTER — Encounter: Payer: Self-pay | Admitting: Family Medicine

## 2020-06-17 DIAGNOSIS — J988 Other specified respiratory disorders: Secondary | ICD-10-CM

## 2020-06-17 MED ORDER — BENZONATATE 200 MG PO CAPS: 200.0000 mg | ORAL_CAPSULE | Freq: Two times a day (BID) | ORAL | 0 refills | Status: DC | PRN

## 2020-06-17 MED ORDER — AZITHROMYCIN 250 MG PO TABS: ORAL_TABLET | ORAL | 0 refills | Status: DC

## 2020-06-17 NOTE — Progress Notes (Signed)
Attempted to contact pt on home and cell phones. No answer.

## 2020-06-17 NOTE — Assessment & Plan Note (Signed)
Discussed supportive and symptomatic care at home with increased fluid intake and rest.  Will add course of azithromycin given prolonged symptoms and immunosuppressant medications.  Tessalon perles as needed for cough.  Instructed to call for follow up if symptoms are worsening or don't improve with recommended treatment.

## 2020-06-17 NOTE — Progress Notes (Signed)
Alexandra Banks - 79 y.o. female MRN [id number]  Date of birth: 1940-12-13   This visit type was conducted due to national recommendations for restrictions regarding the COVID-19 Pandemic (e.g. social distancing).  This format is felt to be most appropriate for this patient at this time.  All issues noted in this document were discussed and addressed.  No physical exam was performed (except for noted visual exam findings with Video Visits).  I discussed the limitations of evaluation and management by telemedicine and the availability of in person appointments. The patient expressed understanding and agreed to proceed.  I connected with@ on 06/17/20 at  4:20 PM EDT by a video enabled telemedicine application and verified that I am speaking with the correct person using two identifiers.  Present at visit: Alexandra Coombe, DO Garlan Fair   Patient Location: Home [address] [place] [address]   Provider location:   [facility]  No chief complaint on file.   HPI  Alexandra Banks is a 79 y.o. female who presents via audio/video conferencing for a telehealth visit today.  She has complaint of cough, congestion, and sneezing.  Symptoms started about 1.5 weeks ago.  Sputum is yellow in coloration.  Symptoms do not seem to be improving.  She denies fever, chills, shortness of breath, nausea, vomiting, diarrhea. She does have history of psoriatic arthritis and is taking humira and methotrexate.     ROS:  A comprehensive ROS was completed and negative except as noted per HPI  Past Medical History:  Diagnosis Date   Anxiety    Arthritis    Asthma    COPD (chronic obstructive pulmonary disease) (HCC)    Depression    pt states she doesn't take anything for this anymore    Insomnia    Psoriatic arthritis (HCC)     Past Surgical History:  Procedure Laterality Date   ABDOMINAL HYSTERECTOMY     BELOW KNEE LEG AMPUTATION Left 2019   KNEE SURGERY     TONSILLECTOMY      Family  History  Problem Relation Age of Onset   Psoriasis Father    Other Father        psoriatic arthritis   Movement disorder Neg Hx     Social History   Socioeconomic History   Marital status: Divorced    Spouse name: Not on file   Number of children: 3   Years of education: Not on file   Highest education level: Master's degree (e.g., MA, MS, MEng, MEd, MSW, MBA)  Occupational History   Not on file  Tobacco Use   Smoking status: Former Smoker    Types: Cigarettes    Quit date: 2011    Years since quitting: 10.6   Smokeless tobacco: Never Used  Building services engineer Use: Never used  Substance and Sexual Activity   Alcohol use: No   Drug use: No   Sexual activity: Not Currently  Other Topics Concern   Not on file  Social History Narrative   Lives with [name] (friend) and [name]'s foster child named Faith    Right handed   Caffeine: "probably 2 cups of coffee in the morning"   Social Determinants of Health   Financial Resource Strain:    Difficulty of Paying Living Expenses: Not on file  Food Insecurity:    Worried About Running Out of Food in the Last Year: Not on file   [employer] of Food in the Last Year: Not on  file  Transportation Needs:    Freight forwarder (Medical): Not on file   Lack of Transportation (Non-Medical): Not on file  Physical Activity:    Days of Exercise per Week: Not on file   Minutes of Exercise per Session: Not on file  Stress:    Feeling of Stress : Not on file  Social Connections:    Frequency of Communication with Friends and Family: Not on file   Frequency of Social Gatherings with Friends and Family: Not on file   Attends Religious Services: Not on file   Active Member of Clubs or Organizations: Not on file   Attends Banker Meetings: Not on file   Marital Status: Not on file  Intimate Partner Violence:    Fear of Current or Ex-Partner: Not on file   Emotionally Abused: Not on file    Physically Abused: Not on file   Sexually Abused: Not on file     Current Outpatient Medications:    Adalimumab 40 MG/0.4ML PNKT, INJECT THE CONTENTS OF 1 PEN INTO THE SKIN EVERY 14 DAYS. DAY 22: BEGIN 40 MG MAINTENANCE DOSE EVERY OTHER WEEK., Disp: , Rfl:    albuterol (ACCUNEB) 0.63 MG/3ML nebulizer solution, Inhale into the lungs., Disp: , Rfl:    albuterol (VENTOLIN HFA) 108 (90 Base) MCG/ACT inhaler, Inhale into the lungs., Disp: , Rfl:    ALPRAZolam (XANAX) 0.5 MG tablet, Take 0.5-1 tablets (0.25-0.5 mg total) by mouth 2 (two) times daily as needed (#90 for 90 days!)., Disp: 90 tablet, Rfl: 1   azithromycin (ZITHROMAX) 250 MG tablet, Take 2 tablets on day 1 followed by 1 tablet on days 2-5, Disp: 6 tablet, Rfl: 0   benzonatate (TESSALON) 200 MG capsule, Take 1 capsule (200 mg total) by mouth 2 (two) times daily as needed for cough., Disp: 20 capsule, Rfl: 0   Fluticasone-Umeclidin-Vilant (TRELEGY ELLIPTA) 100-62.5-25 MCG/INH AEPB, Inhale into the lungs., Disp: , Rfl:    folic acid (FOLVITE) 1 MG tablet, Take 1 mg by mouth daily., Disp: , Rfl:    methotrexate (RHEUMATREX) 2.5 MG tablet, Take 4 pills by mouth on same day each week, Disp: , Rfl:    traZODone (DESYREL) 150 MG tablet, Take 1 tablet (150 mg total) by mouth at bedtime as needed for sleep., Disp: 90 tablet, Rfl: 1   triamcinolone cream (KENALOG) 0.1 %, Apply daily to rash (do not apply to face), Disp: , Rfl:   EXAM:  VITALS per patient if applicable: There were no vitals taken for this visit.  GENERAL: alert, oriented, appears well and in no acute distress  HEENT: atraumatic, conjunttiva clear, no obvious abnormalities on inspection of external nose and ears  NECK: normal movements of the head and neck  LUNGS: on inspection no signs of respiratory distress, breathing rate appears normal, no obvious gross SOB, gasping or wheezing  CV: no obvious cyanosis  MS: moves all visible extremities without noticeable  abnormality  PSYCH/NEURO: pleasant and cooperative, no obvious depression or anxiety, speech and thought processing grossly intact  ASSESSMENT AND PLAN:  Discussed the following assessment and plan:  Respiratory infection Discussed supportive and symptomatic care at home with increased fluid intake and rest.  Will add course of azithromycin given prolonged symptoms and immunosuppressant medications.  Tessalon perles as needed for cough.  Instructed to call for follow up if symptoms are worsening or don't improve with recommended treatment.       I discussed the assessment and treatment plan with the patient.  The patient was provided an opportunity to ask questions and all were answered. The patient agreed with the plan and demonstrated an understanding of the instructions.   The patient was advised to call back or seek an in-person evaluation if the symptoms worsen or if the condition fails to improve as anticipated.    Alexandra Coombe, DO

## 2020-06-21 ENCOUNTER — Other Ambulatory Visit: Payer: Self-pay

## 2020-06-21 MED ORDER — BENZONATATE 200 MG PO CAPS: 200.0000 mg | ORAL_CAPSULE | Freq: Two times a day (BID) | ORAL | 0 refills | Status: DC | PRN

## 2020-06-21 NOTE — Telephone Encounter (Signed)
Alexandra Banks states the benzonatate is not covered or needs a PA. Sent prescription to [facility] with a Good Rx coupon. The cost is only $6.

## 2020-06-22 ENCOUNTER — Telehealth: Payer: Self-pay

## 2020-06-22 NOTE — Telephone Encounter (Signed)
She just completed an antibiotic.  If symptoms are changing, worsening or not improving I would recommend a follow up visit.

## 2020-06-22 NOTE — Telephone Encounter (Signed)
Routing to provider who treated this issue

## 2020-06-22 NOTE — Telephone Encounter (Signed)
Task completed, unable to reach patient. Left detailed message on patients voicemail advising her to schedule a follow up visit if she is not better.

## 2020-06-22 NOTE — Telephone Encounter (Signed)
Patient left voicemail stating she has finished the Zpack regiment prescribed. She states has feels like she  has pneumonia and wants a antibiotic called in. Please advise

## 2020-06-23 ENCOUNTER — Encounter: Payer: Self-pay | Admitting: Family Medicine

## 2020-06-23 ENCOUNTER — Telehealth (INDEPENDENT_AMBULATORY_CARE_PROVIDER_SITE_OTHER): Payer: Medicare Other | Admitting: Family Medicine

## 2020-06-23 DIAGNOSIS — J441 Chronic obstructive pulmonary disease with (acute) exacerbation: Secondary | ICD-10-CM

## 2020-06-23 DIAGNOSIS — J449 Chronic obstructive pulmonary disease, unspecified: Secondary | ICD-10-CM

## 2020-06-23 MED ORDER — AMOXICILLIN-POT CLAVULANATE 875-125 MG PO TABS: 1.0000 | ORAL_TABLET | Freq: Two times a day (BID) | ORAL | 0 refills | Status: DC

## 2020-06-23 MED ORDER — PREDNISONE 20 MG PO TABS: 20.0000 mg | ORAL_TABLET | Freq: Two times a day (BID) | ORAL | 0 refills | Status: AC

## 2020-06-23 NOTE — Progress Notes (Signed)
Alexandra Banks - 79 y.o. female MRN [id number]  Date of birth: 07-Dec-1940   This visit type was conducted due to national recommendations for restrictions regarding the COVID-19 Pandemic (e.g. social distancing).  This format is felt to be most appropriate for this patient at this time.  All issues noted in this document were discussed and addressed.  No physical exam was performed (except for noted visual exam findings with Video Visits).  I discussed the limitations of evaluation and management by telemedicine and the availability of in person appointments. The patient expressed understanding and agreed to proceed.  I connected with@ on 06/23/20 at 11:30 AM EDT by a video enabled telemedicine application and verified that I am speaking with the correct person using two identifiers.  Present at visit: Everrett Coombe, DO Garlan Fair   Patient Location: Home [address] [place] [address]   Provider location:   [facility]  No chief complaint on file.   HPI  Alexandra Banks is a 79 y.o. female who presents via audio/video conferencing for a telehealth visit today.  She is following up from visit last week.  When I had visit with her last week she had symptoms of mild cough with congestion and sneezing of about 1.5 weeks duration.  She was prescribed azithromycin due to prolonged symptoms and immunosuppressant medication.  Reports towards the end of the antibiotic course she started to feel worse with increased chest congestion, some increased wheezing, chills, body aches and sinus pressure. She does not feel significantly short of breath and her respiratory symptoms respond to albuterol.  She denies weakness.  She does have decreased appetite but is drinking normally.      ROS:  A comprehensive ROS was completed and negative except as noted per HPI  Past Medical History:  Diagnosis Date  . Anxiety   . Arthritis   . Asthma   . COPD (chronic obstructive pulmonary disease) (HCC)   .  Depression    pt states she doesn't take anything for this anymore   . Insomnia   . Psoriatic arthritis [facility])     Past Surgical History:  Procedure Laterality Date  . ABDOMINAL HYSTERECTOMY    . BELOW KNEE LEG AMPUTATION Left 2019  . KNEE SURGERY    . TONSILLECTOMY      Family History  Problem Relation Age of Onset  . Psoriasis Father   . Other Father        psoriatic arthritis  . Movement disorder Neg Hx     Social History   Socioeconomic History  . Marital status: Divorced    Spouse name: Not on file  . Number of children: 3  . Years of education: Not on file  . Highest education level: Master's degree (e.g., MA, MS, MEng, MEd, MSW, MBA)  Occupational History  . Not on file  Tobacco Use  . Smoking status: Former Smoker    Types: Cigarettes    Quit date: 2011    Years since quitting: 10.6  . Smokeless tobacco: Never Used  Vaping Use  . Vaping Use: Never used  Substance and Sexual Activity  . Alcohol use: No  . Drug use: No  . Sexual activity: Not Currently  Other Topics Concern  . Not on file  Social History Narrative   Lives with [name] (friend) and [name]'s foster child named Faith    Right handed   Caffeine: "probably 2 cups of coffee in the morning"   Social Determinants of Health  Financial Resource Strain:   . Difficulty of Paying Living Expenses: Not on file  Food Insecurity:   . Worried About Programme researcher, broadcasting/film/video in the Last Year: Not on file  . Ran Out of Food in the Last Year: Not on file  Transportation Needs:   . Lack of Transportation (Medical): Not on file  . Lack of Transportation (Non-Medical): Not on file  Physical Activity:   . Days of Exercise per Week: Not on file  . Minutes of Exercise per Session: Not on file  Stress:   . Feeling of Stress : Not on file  Social Connections:   . Frequency of Communication with Friends and Family: Not on file  . Frequency of Social Gatherings with Friends and Family: Not on file  . Attends Religious  Services: Not on file  . Active Member of Clubs or Organizations: Not on file  . Attends Banker Meetings: Not on file  . Marital Status: Not on file  Intimate Partner Violence:   . Fear of Current or Ex-Partner: Not on file  . Emotionally Abused: Not on file  . Physically Abused: Not on file  . Sexually Abused: Not on file     Current Outpatient Medications:  .  Adalimumab 40 MG/0.4ML PNKT, INJECT THE CONTENTS OF 1 PEN INTO THE SKIN EVERY 14 DAYS. DAY 22: BEGIN 40 MG MAINTENANCE DOSE EVERY OTHER WEEK., Disp: , Rfl:  .  albuterol (ACCUNEB) 0.63 MG/3ML nebulizer solution, Inhale into the lungs., Disp: , Rfl:  .  albuterol (VENTOLIN HFA) 108 (90 Base) MCG/ACT inhaler, Inhale into the lungs., Disp: , Rfl:  .  ALPRAZolam (XANAX) 0.5 MG tablet, Take 0.5-1 tablets (0.25-0.5 mg total) by mouth 2 (two) times daily as needed (#90 for 90 days!)., Disp: 90 tablet, Rfl: 1 .  amoxicillin-clavulanate (AUGMENTIN) 875-125 MG tablet, Take 1 tablet by mouth 2 (two) times daily., Disp: 20 tablet, Rfl: 0 .  azithromycin (ZITHROMAX) 250 MG tablet, Take 2 tablets on day 1 followed by 1 tablet on days 2-5, Disp: 6 tablet, Rfl: 0 .  benzonatate (TESSALON) 200 MG capsule, Take 1 capsule (200 mg total) by mouth 2 (two) times daily as needed for cough. [id number] PCN [employer] [id number] Member ID [id number] Good Rx., Disp: 20 capsule, Rfl: 0 .  Fluticasone-Umeclidin-Vilant (TRELEGY ELLIPTA) 100-62.5-25 MCG/INH AEPB, Inhale into the lungs., Disp: , Rfl:  .  folic acid (FOLVITE) 1 MG tablet, Take 1 mg by mouth daily., Disp: , Rfl:  .  methotrexate (RHEUMATREX) 2.5 MG tablet, Take 4 pills by mouth on same day each week, Disp: , Rfl:  .  predniSONE (DELTASONE) 20 MG tablet, Take 1 tablet (20 mg total) by mouth 2 (two) times daily with a meal for 5 days., Disp: 10 tablet, Rfl: 0 .  traZODone (DESYREL) 150 MG tablet, Take 1 tablet (150 mg total) by mouth at bedtime as needed for sleep., Disp: 90 tablet, Rfl: 1 .   triamcinolone cream (KENALOG) 0.1 %, Apply daily to rash (do not apply to face), Disp: , Rfl:   EXAM:  VITALS per patient if applicable: There were no vitals taken for this visit.  GENERAL: alert, oriented, appears well and in no acute distress  HEENT: atraumatic, conjunttiva clear, no obvious abnormalities on inspection of external nose and ears  NECK: normal movements of the head and neck  LUNGS: on inspection no signs of respiratory distress, breathing rate appears normal, no obvious gross SOB, gasping or wheezing  CV: no  obvious cyanosis  MS: moves all visible extremities without noticeable abnormality  PSYCH/NEURO: pleasant and cooperative, no obvious depression or anxiety, speech and thought processing grossly intact  ASSESSMENT AND PLAN:  Discussed the following assessment and plan:  COPD exacerbation (HCC) -Recently completed course of azithromycin.  I am adding augmentin and steroid burst.  -With worsening of symptoms since last week I recommended that she get COVID testing.  She is unable to stop by the clinic to have this completed due to not having transportation.  -Continue current inhalers -2 view chest xray ordered, she will try to stop by and have this completed tomorrow.        I discussed the assessment and treatment plan with the patient. The patient was provided an opportunity to ask questions and all were answered. The patient agreed with the plan and demonstrated an understanding of the instructions.   The patient was advised to call back or seek an in-person evaluation if the symptoms worsen or if the condition fails to improve as anticipated.    Everrett Coombe, DO

## 2020-06-23 NOTE — Assessment & Plan Note (Signed)
-  Recently completed course of azithromycin.  I am adding augmentin and steroid burst.  -With worsening of symptoms since last week I recommended that she get COVID testing.  She is unable to stop by the clinic to have this completed due to not having transportation.  -Continue current inhalers -2 view chest xray ordered, she will try to stop by and have this completed tomorrow.

## 2020-06-23 NOTE — Progress Notes (Signed)
Attempted to contact patient on mobile phone = VM.  Attempted to contact patient on home phone = no answer.

## 2020-06-24 ENCOUNTER — Encounter: Payer: Self-pay | Admitting: Family Medicine

## 2020-06-25 ENCOUNTER — Ambulatory Visit (INDEPENDENT_AMBULATORY_CARE_PROVIDER_SITE_OTHER): Payer: Medicare Other | Admitting: Nurse Practitioner

## 2020-06-25 ENCOUNTER — Ambulatory Visit (INDEPENDENT_AMBULATORY_CARE_PROVIDER_SITE_OTHER): Payer: Medicare Other

## 2020-06-25 ENCOUNTER — Other Ambulatory Visit: Payer: Self-pay

## 2020-06-25 DIAGNOSIS — J449 Chronic obstructive pulmonary disease, unspecified: Secondary | ICD-10-CM | POA: Diagnosis not present

## 2020-06-25 DIAGNOSIS — Z20822 Contact with and (suspected) exposure to covid-19: Secondary | ICD-10-CM

## 2020-06-25 NOTE — Progress Notes (Signed)
Encounter for testing for COVID-19 virus.

## 2020-06-25 NOTE — Patient Instructions (Signed)
We will notify you of results as soon as they come through.  You may be notified through MyChart when the results of come through.  Please note that you may see results before your provider does.  If test results are positive you may be contacted by someone to discuss monoclonal antibody infusion treatment which is highly recommended for everyone over the age of 25 with positive COVID-19.

## 2020-06-27 ENCOUNTER — Encounter: Payer: Self-pay | Admitting: Nurse Practitioner

## 2020-06-27 LAB — NOVEL CORONAVIRUS, NAA: SARS-CoV-2, NAA: NOT DETECTED

## 2020-08-02 ENCOUNTER — Other Ambulatory Visit: Payer: Self-pay | Admitting: Osteopathic Medicine

## 2020-08-02 NOTE — Telephone Encounter (Signed)
90 for 90 days, last filled 05/08/20, due for refill 08/06/20, Rx dated for then

## 2020-08-12 ENCOUNTER — Ambulatory Visit: Payer: Medicaid Other | Admitting: Osteopathic Medicine

## 2020-08-17 ENCOUNTER — Telehealth (INDEPENDENT_AMBULATORY_CARE_PROVIDER_SITE_OTHER): Payer: Medicare Other | Admitting: Osteopathic Medicine

## 2020-08-17 ENCOUNTER — Other Ambulatory Visit: Payer: Self-pay

## 2020-08-17 ENCOUNTER — Encounter: Payer: Self-pay | Admitting: Osteopathic Medicine

## 2020-08-17 DIAGNOSIS — R0602 Shortness of breath: Secondary | ICD-10-CM

## 2020-08-17 NOTE — Progress Notes (Signed)
Virtual Visit via Phone I connected with      Alexandra Banks on 08/17/20 at 11:23 AM  by a telemedicine application and verified that I am speaking with the correct person using two identifiers.  Patient is at home I am in office   I discussed the limitations of evaluation and management by telemedicine and the availability of in person appointments. The patient expressed understanding and agreed to proceed.  History of Present Illness: Alexandra Banks is a 79 y.o. female who would like to discuss SOB, cough   Coughing, some productive dark colored sputum, trouble breathing with normal ADL's. Known COPD - pt states this feels worse than usual COPD flare, SOB is more pronounced.      Observations/Objective: There were no vitals taken for this visit. BP Readings from Last 3 Encounters:  04/23/20 126/81  02/11/20 121/74  01/22/20 125/72   Exam: Normal Speech.    Lab and Radiology Results No results found for this or any previous visit (from the past 72 hour(s)). No results found.     Assessment and Plan: 79 y.o. female with The encounter diagnosis was Shortness of breath.  COPD possible, but I can certainly not evaluate over the phone for other potential issues such as pneumonia, PE/VTE, cardiac issue, other respiratory compromise, etc. Pt states she cannot get a ride to the office but can call ambulance to take her to ER. I advised this might be best, she needs urgent/emergent evaluation.     Follow Up Instructions: Return for recheck pendign ER eval/tx .    I discussed the assessment and treatment plan with the patient. The patient was provided an opportunity to ask questions and all were answered. The patient agreed with the plan and demonstrated an understanding of the instructions.     15 minutes of non-face-to-face time was provided during this encounter.      . . . . . . . . . . . . . Marland Kitchen                   Historical  information moved to improve visibility of documentation.  Past Medical History:  Diagnosis Date   Anxiety    Arthritis    Asthma    COPD (chronic obstructive pulmonary disease) (HCC)    Depression    pt states she doesn't take anything for this anymore    Insomnia    Psoriatic arthritis (HCC)    Past Surgical History:  Procedure Laterality Date   ABDOMINAL HYSTERECTOMY     BELOW KNEE LEG AMPUTATION Left 2019   KNEE SURGERY     TONSILLECTOMY     Social History   Tobacco Use   Smoking status: Former Smoker    Types: Cigarettes    Quit date: 2011    Years since quitting: 10.8   Smokeless tobacco: Never Used  Substance Use Topics   Alcohol use: No   family history includes Other in her father; Psoriasis in her father.  Medications: Current Outpatient Medications  Medication Sig Dispense Refill   Adalimumab 40 MG/0.4ML PNKT INJECT THE CONTENTS OF 1 PEN INTO THE SKIN EVERY 14 DAYS. DAY 22: BEGIN 40 MG MAINTENANCE DOSE EVERY OTHER WEEK.     albuterol (ACCUNEB) 0.63 MG/3ML nebulizer solution Inhale into the lungs.     albuterol (VENTOLIN HFA) 108 (90 Base) MCG/ACT inhaler Inhale into the lungs.     ALPRAZolam (XANAX) 0.5 MG tablet TAKE 0.5-1 TABLETS 2 TIMES DAILY  AS NEEDED (#90 FOR 90 DAYS!). 90 tablet 1   amoxicillin-clavulanate (AUGMENTIN) 875-125 MG tablet Take 1 tablet by mouth 2 (two) times daily. 20 tablet 0   benzonatate (TESSALON) 200 MG capsule Take 1 capsule (200 mg total) by mouth 2 (two) times daily as needed for cough. [id number] PCN [employer] [id number] Member ID [id number] Good Rx. 20 capsule 0   Fluticasone-Umeclidin-Vilant (TRELEGY ELLIPTA) 100-62.5-25 MCG/INH AEPB Inhale into the lungs.     folic acid (FOLVITE) 1 MG tablet Take 1 mg by mouth daily.     methotrexate (RHEUMATREX) 2.5 MG tablet Take 4 pills by mouth on same day each week     traZODone (DESYREL) 150 MG tablet Take 1 tablet (150 mg total) by mouth at bedtime as needed for sleep. 90  tablet 1   triamcinolone cream (KENALOG) 0.1 % Apply daily to rash (do not apply to face)     azithromycin (ZITHROMAX) 250 MG tablet Take 2 tablets on day 1 followed by 1 tablet on days 2-5 (Patient not taking: Reported on 08/17/2020) 6 tablet 0   No current facility-administered medications for this visit.   Allergies  Allergen Reactions   Clindamycin/Lincomycin Rash   Codeine Other (See Comments)    seizure    Varenicline Rash   Other Other (See Comments)    Pollen: Watery eyes, runny nose

## 2020-08-17 NOTE — Progress Notes (Signed)
Attempted to contact patient at 1015 am, 1045 am & 1055 am. No answer, goes directly to vm. Left multiple detailed vm msgs.

## 2020-09-12 ENCOUNTER — Other Ambulatory Visit: Payer: Self-pay | Admitting: Osteopathic Medicine

## 2020-10-07 ENCOUNTER — Ambulatory Visit (INDEPENDENT_AMBULATORY_CARE_PROVIDER_SITE_OTHER): Payer: Medicare Other | Admitting: Osteopathic Medicine

## 2020-10-07 ENCOUNTER — Encounter: Payer: Self-pay | Admitting: Osteopathic Medicine

## 2020-10-07 ENCOUNTER — Other Ambulatory Visit: Payer: Self-pay

## 2020-10-07 VITALS — BP 126/82 | HR 96 | Temp 98.3°F | Wt 182.1 lb

## 2020-10-07 DIAGNOSIS — Z87891 Personal history of nicotine dependence: Secondary | ICD-10-CM | POA: Diagnosis not present

## 2020-10-07 DIAGNOSIS — Z23 Encounter for immunization: Secondary | ICD-10-CM

## 2020-10-07 NOTE — Patient Instructions (Addendum)
Plan:  To avoid falls, option for a few sessions with physcial therapy to work on balance exercises. Please ensure adequate calcium and vitamin D intake: Calcium 1200-1300 mg per day, vitamin D [phone] units per day. Bone density test optional.   Vaccines are up to date, will confirm with [facility]   Hearing difficulty: continue w/ audiology.   Other specialists: continue follow-up with dermatology (Dr. Bradly Chris) as directed.   Cancer screenings: Mammograms optional Colonoscopy no longer required if previously normal  Lung cancer screening: CT chest every year for those aged 53 to 80 years who have a 20 pack-year smoking history and currently smoke or have quit within the past 15 years - will refer for screening

## 2020-10-07 NOTE — Progress Notes (Signed)
HPI: Alexandra Banks is a 79 y.o. female  who presents to [facility] today, 10/07/20,  for Medicare Annual Wellness Exam  Patient presents for annual physical/Medicare wellness exam. NO complaints today.   Past medical, surgical, social and family history reviewed:  Patient Active Problem List   Diagnosis Date Noted  . COPD exacerbation (HCC) 06/23/2020  . Respiratory infection 06/17/2020  . Numbness and tingling in right hand 01/22/2020  . Tardive dyskinesia 01/22/2020  . 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 Psoriatic arthritis (HCC) 02/26/2019  . 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 COPD, severe (HCC) 02/26/2017  . Status post left foot surgery 07/10/2014  . Age related osteoporosis 04/21/2013  . Spinal stenosis of lumbar region 12/12/2011  . Status post total abdominal hysterectomy and bilateral salpingo-oophorectomy 12/12/2011  . Recurrent major depressive disorder, in full remission (HCC) 12/12/2011    Past Surgical History:  Procedure Laterality Date  . ABDOMINAL HYSTERECTOMY    . BELOW KNEE LEG AMPUTATION Left 2019  . KNEE SURGERY    . TONSILLECTOMY      Social History   Socioeconomic History  . Marital status: Divorced    Spouse name: Not on file  . Number of children: 3  . Years of education: Not on file  . Highest education level: Master's degree (e.g., MA, MS, MEng, MEd, MSW, MBA)  Occupational History  . Not on file  Tobacco Use  . Smoking status: Former Smoker    Types: Cigarettes    Quit date: 2011    Years since quitting: 10.9  . Smokeless tobacco: Never Used  Vaping Use  . Vaping Use: Never used  Substance and Sexual Activity  . Alcohol use: No  . Drug use: No  . Sexual activity: Not Currently  Other Topics Concern  . Not on file  Social History Narrative   Lives with [name] (friend) and [name]'s foster child named Faith    Right  handed   Caffeine: "probably 2 cups of coffee in the morning"   Social Determinants of Health   Financial Resource Strain: Not on file  Food Insecurity: Not on file  Transportation Needs: Not on file  Physical Activity: Not on file  Stress: Not on file  Social Connections: Not on file  Intimate Partner Violence: Not on file    Family History  Problem Relation Age of Onset  . Psoriasis Father   . Other Father        psoriatic arthritis  . Movement disorder Neg Hx      Current medication list and allergy/intolerance information reviewed:    Outpatient Encounter Medications as of 10/07/2020  Medication Sig Note  . Adalimumab 40 MG/0.4ML PNKT INJECT THE CONTENTS OF 1 PEN INTO THE SKIN EVERY 14 DAYS. DAY 22: BEGIN 40 MG MAINTENANCE DOSE EVERY OTHER WEEK.   Marland Kitchen albuterol (VENTOLIN HFA) 108 (90 Base) MCG/ACT inhaler Inhale into the lungs. 09/17/2019: PRN  . ALPRAZolam (XANAX) 0.5 MG tablet TAKE 0.5-1 TABLETS 2 TIMES DAILY AS NEEDED (#90 FOR 90 DAYS!).   Marland Kitchen Fluticasone-Umeclidin-Vilant (TRELEGY ELLIPTA) 100-62.5-25 MCG/INH AEPB Inhale into the lungs.   . traZODone (DESYREL) 150 MG tablet TAKE 1 TABLET (150 MG TOTAL) BY MOUTH AT BEDTIME AS NEEDED FOR SLEEP.   . [DISCONTINUED] albuterol (ACCUNEB) 0.63 MG/3ML nebulizer solution Inhale into the lungs. (Patient not taking: Reported on 10/07/2020) 09/17/2019: PRN  . [DISCONTINUED] amoxicillin-clavulanate (AUGMENTIN) 875-125 MG tablet  Take 1 tablet by mouth 2 (two) times daily. (Patient not taking: Reported on 10/07/2020)   . [DISCONTINUED] azithromycin (ZITHROMAX) 250 MG tablet Take 2 tablets on day 1 followed by 1 tablet on days 2-5 (Patient not taking: No sig reported)   . [DISCONTINUED] benzonatate (TESSALON) 200 MG capsule Take 1 capsule (200 mg total) by mouth 2 (two) times daily as needed for cough. [id number] PCN [employer] [id number] Member ID [id number] Good Rx. (Patient not taking: Reported on 10/07/2020)   . [DISCONTINUED] folic acid (FOLVITE) 1  MG tablet Take 1 mg by mouth daily. (Patient not taking: Reported on 10/07/2020)   . [DISCONTINUED] methotrexate (RHEUMATREX) 2.5 MG tablet Take 4 pills by mouth on same day each week (Patient not taking: Reported on 10/07/2020)   . [DISCONTINUED] triamcinolone cream (KENALOG) 0.1 % Apply daily to rash (do not apply to face) (Patient not taking: Reported on 10/07/2020)    No facility-administered encounter medications on file as of 10/07/2020.    Allergies  Allergen Reactions  . Clindamycin/Lincomycin Rash  . Codeine Other (See Comments)    seizure   . Varenicline Rash  . Other Other (See Comments)    Pollen: Watery eyes, runny nose       Review of Systems: Review of Systems - negative   Medicare Wellness Questionnaire  Are there smokers in your home (other than you)? no  Depression Screen (Note: if answer to either of the following is "Yes", a more complete depression screening is indicated)   Q1: Over the past two weeks, have you felt down, depressed or hopeless? no  Q2: Over the past two weeks, have you felt little interest or pleasure in doing things? no  Have you lost interest or pleasure in daily life? no  Do you often feel hopeless? no  Do you cry easily over simple problems? no  Activities of Daily Living In your present state of health, do you have any difficulty performing the following activities?:  Driving? no Managing money?  no Feeding yourself? no Getting from bed to chair? no Climbing a flight of stairs? yes Preparing food and eating?: no Bathing or showering? no Getting dressed: no Getting to the toilet? no Using the toilet: no Moving around from place to place: no In the past year have you fallen or had a near fall?: yes  Hearing Difficulties:  Do you often ask people to speak up or repeat themselves? yes Do you experience ringing or noises in your ears? yes  Do you have difficulty understanding soft or whispered voices? yes  Memory  Difficulties:  Do you feel that you have a problem with memory? no  Do you often misplace items? no  Do you feel safe at home?  yes  Sexual Health:   Are you sexually active?  No  Do you have more than one partner?  No  Advanced Directives:   Advanced directives discussed: has an advanced directive - a copy HAS NOT been provided.  Additional information provided: no  Risk Factors  Current exercise habits: minimal d/t ambulatory issues  Dietary issues discussed:no concerns   Cardiac risk factors: generalized debilitation, positive family history   Exam:  BP 126/82 (BP Location: Left Arm, Patient Position: Sitting, Cuff Size: Large)   Pulse 96   Temp 98.3 F (36.8 C) (Oral)   Wt 182 lb 1.9 oz (82.6 kg)   BMI 32.26 kg/m  Vision by Snellen chart: right eye:see nurse notes, left eye:see nurse notes  Constitutional: VS  see above. General Appearance: alert, well-developed, well-nourished, NAD  Ears, Nose, Mouth, Throat: MMM  Neck: No masses, trachea midline.   Respiratory: Normal respiratory effort. no wheeze, no rhonchi, no rales  Cardiovascular:No lower extremity edema.   Musculoskeletal: Gait normal. No clubbing/cyanosis of digits.   Neurological: Normal balance/coordination. No tremor. Recalls 3 objects and able to read face of watch with correct time.   Skin: warm, dry, intact. No rash/ulcer.   Psychiatric: Normal judgment/insight. Normal mood and affect. Oriented x3.     ASSESSMENT/PLAN:   Encounter for Medicare annual wellness exam  Need for influenza vaccination - Plan: Flu Vaccine QUAD High Dose(Fluad)  Former smoker - Plan: Ambulatory Referral for Lung Cancer Scre  Patient Instructions  Plan:  To avoid falls, option for a few sessions with physcial therapy to work on balance exercises. Please ensure adequate calcium and vitamin D intake: Calcium 1200-1300 mg per day, vitamin D [phone] units per day. Bone density test optional.   Vaccines are up to  date, will confirm with [facility]   Hearing difficulty: continue w/ audiology.   Other specialists: continue follow-up with dermatology (Dr. Bradly Chris) as directed.   Cancer screenings: Mammograms optional Colonoscopy no longer required if previously normal  Lung cancer screening: CT chest every year for those aged 86 to 80 years who have a 20 pack-year smoking history and currently smoke or have quit within the past 15 years - will refer for screening           CANCER SCREENING  Lung - USPSTF: 55-80yo w/ 30 py hx unless quit w/in 36yr - needs   Colon - does not need  Prostate - does not need  Breast - does not need  Cervical - does not need OTHER DISEASE SCREENING  Lipid - needs  DM2 - needs  AAA - female 29-75yo ever smoked - does not need  Osteoporosis - women 77yo+, men 77yo+ - needs INFECTIOUS DISEASE SCREENING  HIV - does not need  GC/CT - does not need  HepC -does not need  TB - does not need ADULT VACCINATION  Influenza - annual vaccine recommended  Td - booster every 10 years - does not need  Zoster - option at 50, yes at 51+ - does not need  PCV13 - does not need  PPSV23 - does not need Immunization History  Administered Date(s) Administered  . Fluad Quad(high Dose 65+) 10/07/2020  . Influenza-Unspecified 08/13/2012, 09/30/2013, 08/26/2014, 07/29/2015, 08/30/2016, 09/04/2017, 09/03/2019  . Moderna Sars-Covid-2 Vaccination 06/06/2020  . Pneumococcal Conjugate-13 09/30/2013  . Pneumococcal Polysaccharide-23 10/27/2005, 09/04/2017  . Td 10/23/2000  . Tdap 02/28/2013  . Zoster 08/13/2012   OTHER  Fall - exercise and Vit D age 60+ - needs  Advanced Directives -  Discussed as above   During the course of the visit the patient was educated and counseled about appropriate screening and preventive services as noted above.   Patient Instructions (the written plan) was given to the patient.  Medicare Attestation I have personally reviewed: The  patient's medical and social history Their use of alcohol, tobacco or illicit drugs Their current medications and supplements The patient's functional ability including ADLs,fall risks, home safety risks, cognitive, and hearing and visual impairment Diet and physical activities Evidence for depression or mood disorders  The patient's weight, height, BMI, and visual acuity have been recorded in the chart.  I have made referrals, counseling, and provided education to the patient based on review of the above and I have provided  the patient with a written personalized care plan for preventive services.     Sunnie Nielsen, DO   10/08/20   Visit summary with medication list and pertinent instructions was printed for patient to review. All questions at time of visit were answered - patient instructed to contact office with any additional concerns. ER/RTC precautions were reviewed with the patient. Follow-up plan: Return in about 6 months (around 04/07/2021) for [employer] WITH LABS - SEE [place] SOONER IF NEEDED.

## 2021-01-26 ENCOUNTER — Telehealth (INDEPENDENT_AMBULATORY_CARE_PROVIDER_SITE_OTHER): Payer: Self-pay | Admitting: Osteopathic Medicine

## 2021-01-26 DIAGNOSIS — R200 Anesthesia of skin: Secondary | ICD-10-CM

## 2021-01-26 DIAGNOSIS — F3342 Major depressive disorder, recurrent, in full remission: Secondary | ICD-10-CM

## 2021-01-26 DIAGNOSIS — L4050 Arthropathic psoriasis, unspecified: Secondary | ICD-10-CM

## 2021-01-26 DIAGNOSIS — M48062 Spinal stenosis, lumbar region with neurogenic claudication: Secondary | ICD-10-CM

## 2021-01-26 DIAGNOSIS — R202 Paresthesia of skin: Secondary | ICD-10-CM

## 2021-01-26 DIAGNOSIS — G2401 Drug induced subacute dyskinesia: Secondary | ICD-10-CM

## 2021-01-26 DIAGNOSIS — H903 Sensorineural hearing loss, bilateral: Secondary | ICD-10-CM

## 2021-01-26 DIAGNOSIS — Z89512 Acquired absence of left leg below knee: Secondary | ICD-10-CM

## 2021-01-26 DIAGNOSIS — J449 Chronic obstructive pulmonary disease, unspecified: Secondary | ICD-10-CM

## 2021-01-26 NOTE — Telephone Encounter (Signed)
Patient dropped off medical evaluation form for social services (foster mom) and was requesting to have it done by Friday or ATLEAST Monday, patient was advised its usually a week (3-5 days atleast) for paperwork and was told can't make any promises to when exactly it will be done, but told that I would let the PCP know she is requesting it to be done by then. Billing form placed on paper and placed in provider box. AM (01/26/21) * 

## 2021-01-30 ENCOUNTER — Other Ambulatory Visit: Payer: Self-pay | Admitting: Osteopathic Medicine

## 2021-01-30 IMAGING — DX DG CHEST 2V
2 series · 2 of 2 positions shown · non-contrast
Comparison: November 02, 2012

CLINICAL DATA: Cough.  COPD.

EXAM:
CHEST - 2 VIEW

[chest pa]
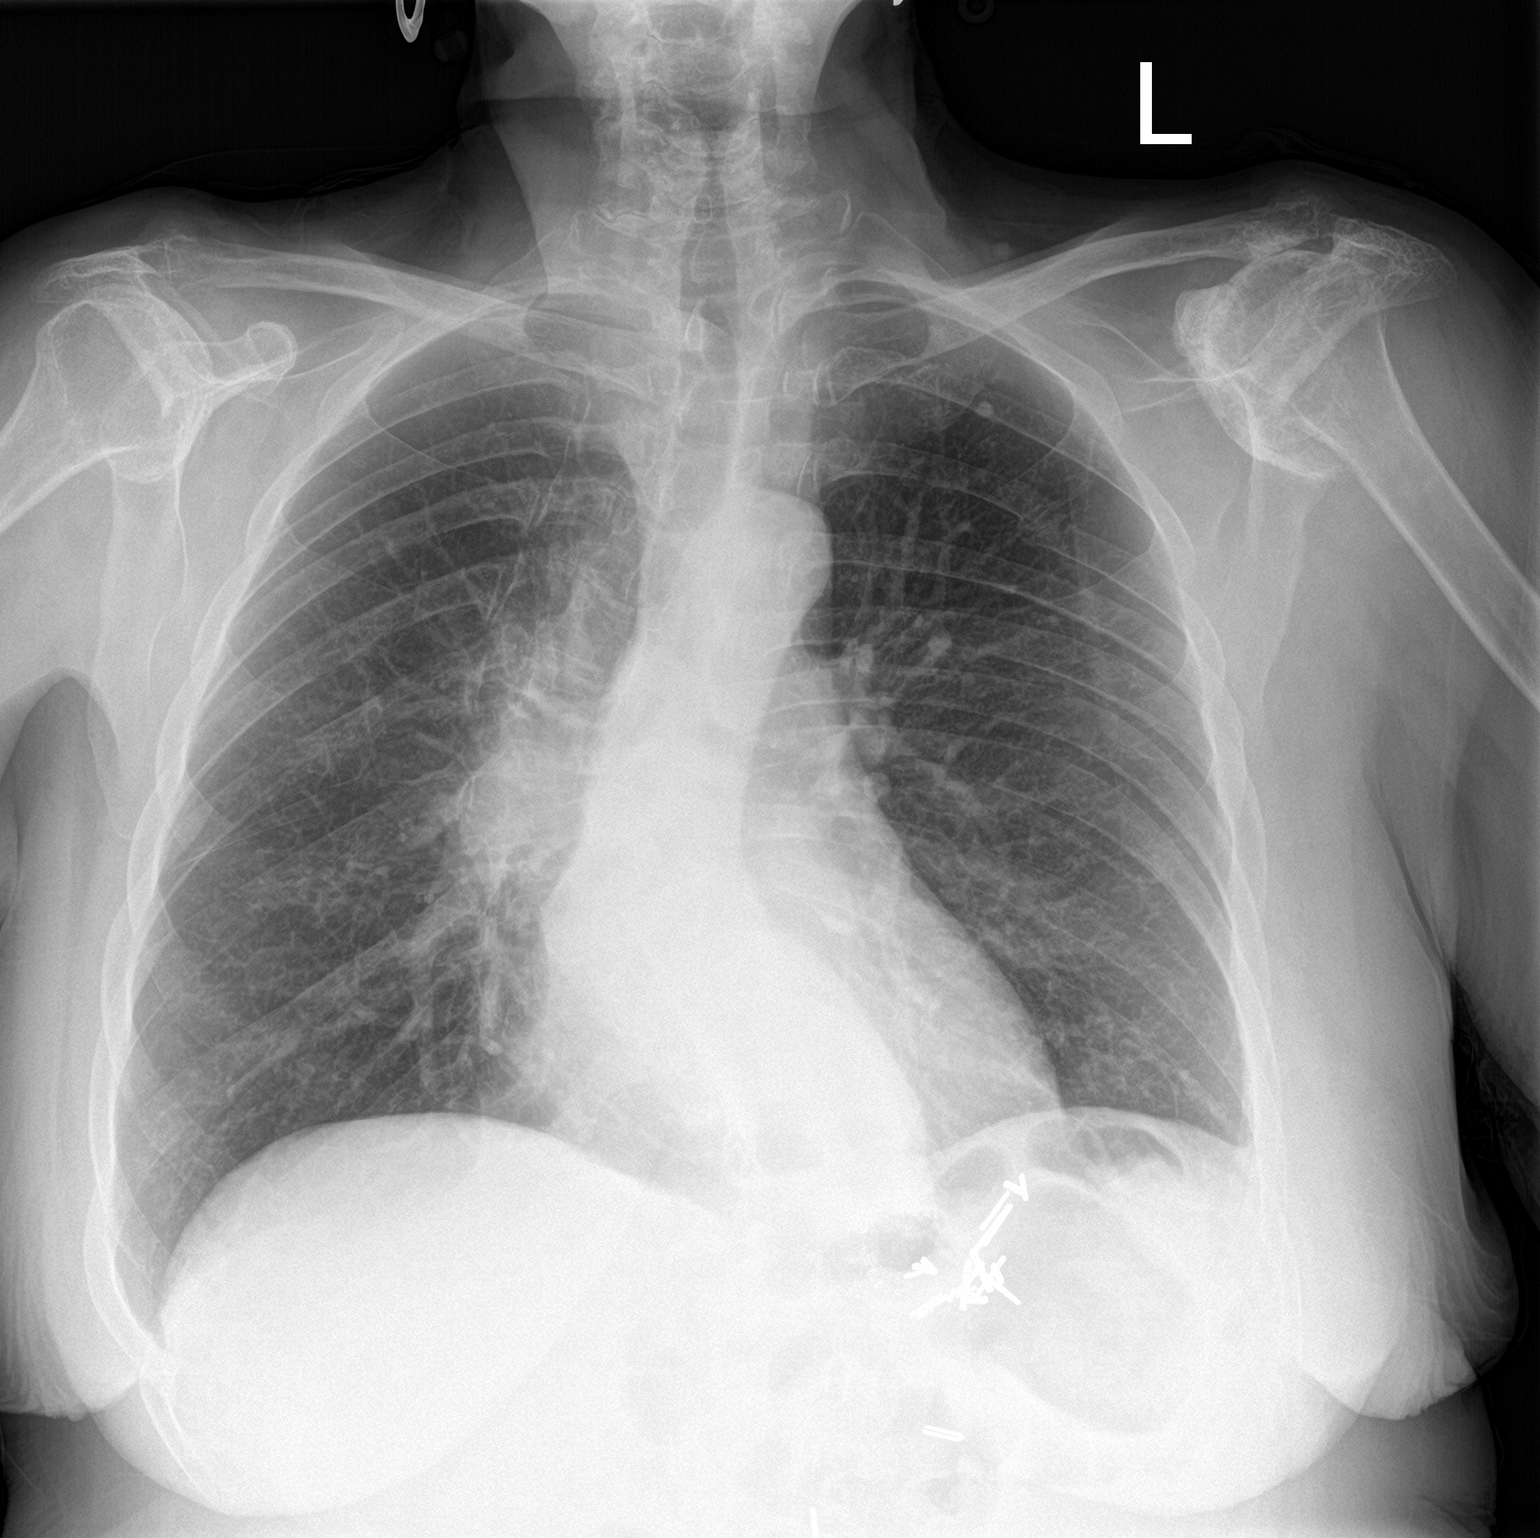

[chest lat]
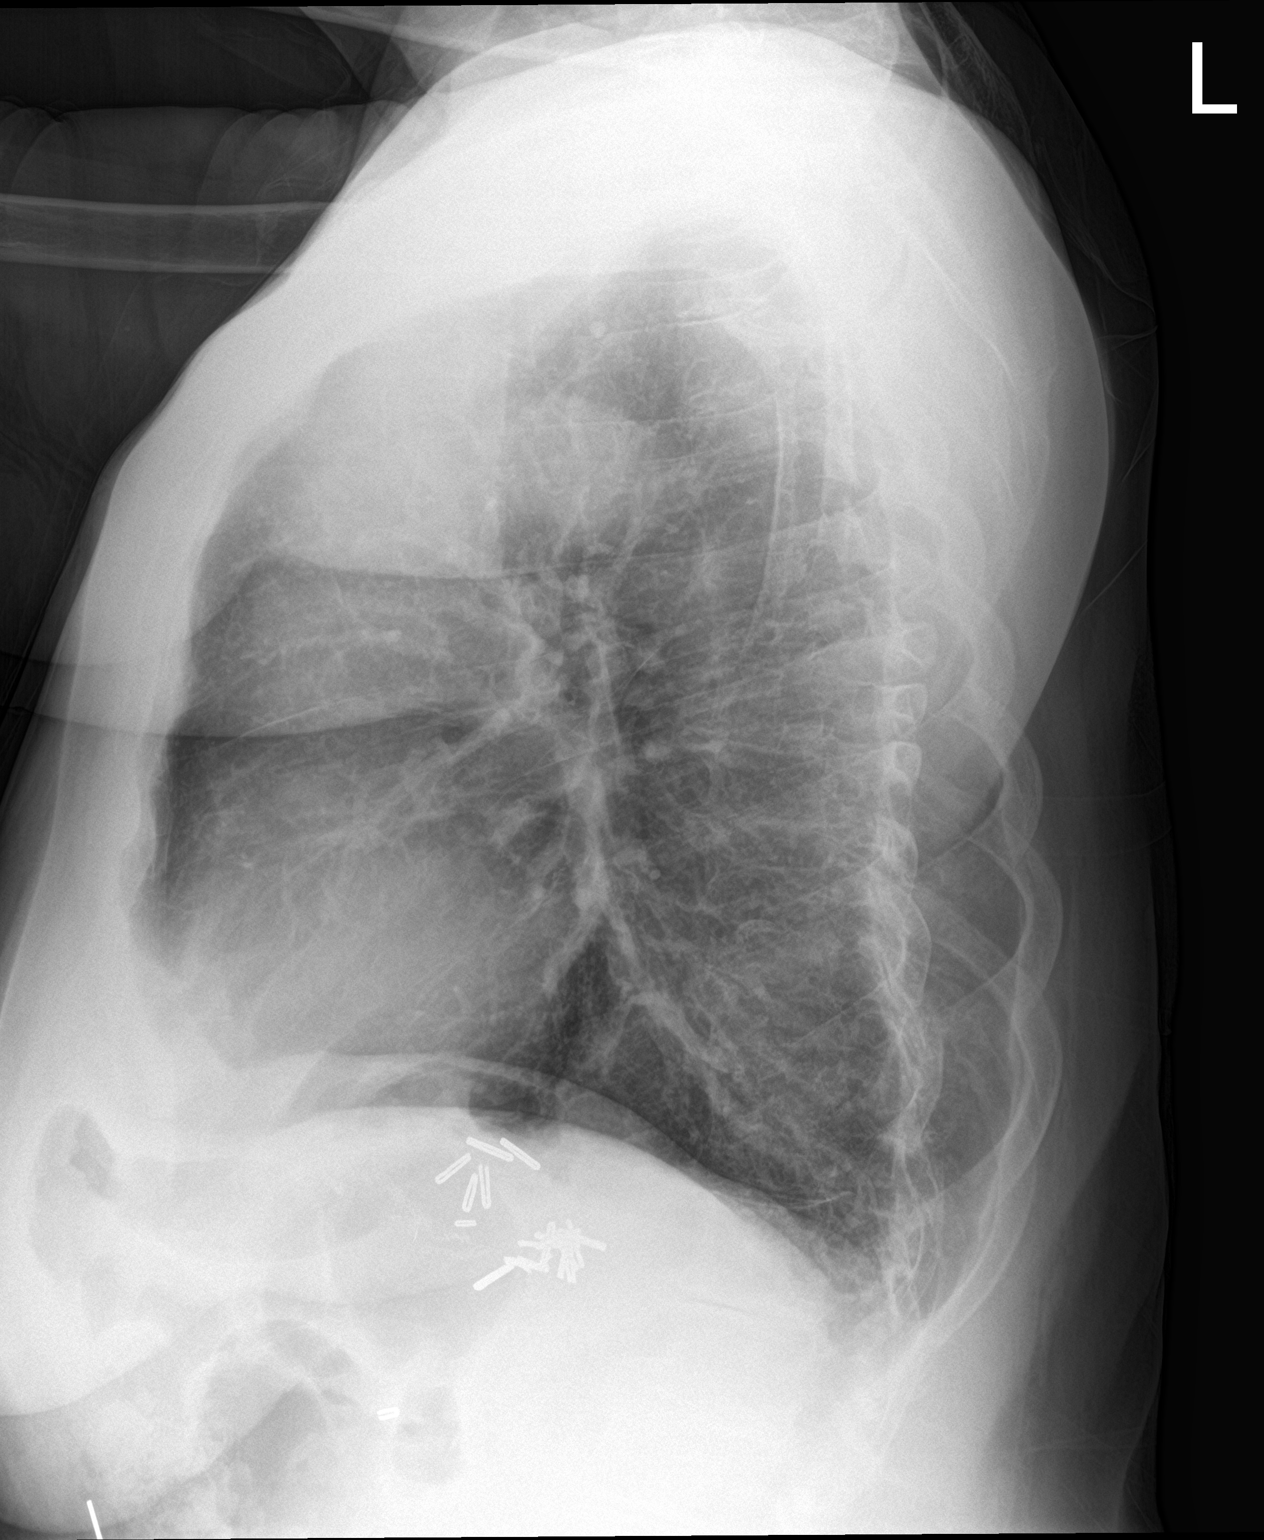

[2 of 2 positions shown; findings below may reference images not displayed]

FINDINGS: Cardiomediastinal silhouette is normal. Mediastinal contours appear
intact. Stable tortuosity of the aorta.

There is no evidence of focal airspace consolidation, pleural
effusion or pneumothorax.

Osseous structures are without acute abnormality. Postsurgical
changes below the left hemidiaphragm are stable.
IMPRESSION: No active cardiopulmonary disease.

## 2021-01-31 NOTE — Telephone Encounter (Signed)
Completed and sent up front FYI patient not seen by me >1 year needs annual physical please schedule

## 2021-01-31 NOTE — Telephone Encounter (Signed)
Sending phone note about patient who needed paperwork completed asap.ANM

## 2021-02-01 NOTE — Telephone Encounter (Signed)
Pt was advised of form completion. She has an appt scheduled in June to f/u

## 2021-02-08 ENCOUNTER — Telehealth: Payer: Self-pay

## 2021-02-08 ENCOUNTER — Other Ambulatory Visit: Payer: Self-pay

## 2021-02-08 ENCOUNTER — Emergency Department (INDEPENDENT_AMBULATORY_CARE_PROVIDER_SITE_OTHER)
Admission: RE | Admit: 2021-02-08 | Discharge: 2021-02-08 | Disposition: A | Discharge status: Home / Self Care | Payer: Medicare Other | Source: Ambulatory Visit | Attending: Family Medicine | Admitting: Family Medicine

## 2021-02-08 VITALS — BP 152/83 | HR 64 | Temp 98.4°F | Resp 20 | Ht 63.0 in | Wt 180.0 lb

## 2021-02-08 DIAGNOSIS — J441 Chronic obstructive pulmonary disease with (acute) exacerbation: Secondary | ICD-10-CM

## 2021-02-08 DIAGNOSIS — J069 Acute upper respiratory infection, unspecified: Secondary | ICD-10-CM

## 2021-02-08 DIAGNOSIS — J988 Other specified respiratory disorders: Secondary | ICD-10-CM

## 2021-02-08 MED ORDER — PREDNISONE 20 MG PO TABS: 20.0000 mg | ORAL_TABLET | Freq: Two times a day (BID) | ORAL | 0 refills | Status: DC

## 2021-02-08 MED ORDER — AZITHROMYCIN 250 MG PO TABS: ORAL_TABLET | ORAL | 0 refills | Status: DC

## 2021-02-08 NOTE — Telephone Encounter (Signed)
Called pt, phone went straight to VM. Left VM with number for return call to schedule appt.

## 2021-02-08 NOTE — Telephone Encounter (Signed)
LVM on patient's cell phone to call back to get this appt scheduled. Also tried to call patients home phone and it just rang/no answer.

## 2021-02-08 NOTE — Discharge Instructions (Addendum)
Take azithromycin as directed.  2 pills today then 1 a day for 5 days Take prednisone 2 times a day Drink plenty of fluids Continue your inhalers and nebulizer May use over-the-counter cough and cold medicines  Check MyChart for your test results

## 2021-02-08 NOTE — Telephone Encounter (Signed)
Pt called requesting a rx for antibiotic. Per pt, he and several other family members have been sick with flu like symptoms. Pls contact the patient to schedule a virtual appt with provider or any other available Nps. Thanks in advance.

## 2021-02-08 NOTE — ED Provider Notes (Signed)
[facility]    CSN: [id number] Arrival date & time: 02/08/21  1756      History   Chief Complaint Chief Complaint  Patient presents with  . Cough  . Sore Throat    HPI Alexandra Banks is a 80 y.o. female.   HPI   80 year old female who lives with a caregiver.  The caregiver has 2 foster children.  Both foster children are sick with a cough.  This patient has been COVID vaccinated and boosted.  She has known COPD.  She uses inhalers chronically.  She had a BKA on the left for a Charcot foot.  She spends much of her time in a wheelchair. She is had a cough since Sunday.  This is the third day.  She states that she has sore throat, generalized weakness, generalized fatigue.  Coughing up tan and gray sputum.  No blood streaks.  Quit smoking many years ago.  Past Medical History:  Diagnosis Date  . Anxiety   . Arthritis   . Asthma   . COPD (chronic obstructive pulmonary disease) (HCC)   . Depression    pt states she doesn't take anything for this anymore   . Insomnia   . Psoriatic arthritis [facility])     Patient Active Problem List   Diagnosis Date Noted  . COPD exacerbation (HCC) 06/23/2020  . Respiratory infection 06/17/2020  . Numbness and tingling in right hand 01/22/2020  . Tardive dyskinesia 01/22/2020  . 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 Psoriatic arthritis (HCC) 02/26/2019  . 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 COPD, severe (HCC) 02/26/2017  . Status post left foot surgery 07/10/2014  . Age related osteoporosis 04/21/2013  . Spinal stenosis of lumbar region 12/12/2011  . Status post total abdominal hysterectomy and bilateral salpingo-oophorectomy 12/12/2011  . Recurrent major depressive disorder, in full remission (HCC) 12/12/2011    Past Surgical History:  Procedure Laterality Date  . ABDOMINAL HYSTERECTOMY    . BELOW KNEE LEG AMPUTATION Left 2019  . KNEE  SURGERY    . TONSILLECTOMY      OB History   No obstetric history on file.      Home Medications    Prior to Admission medications   Medication Sig Start Date End Date Taking? Authorizing Provider  azithromycin (ZITHROMAX Z-PAK) 250 MG tablet Take two pills today followed by one a day until gone 02/08/21  Yes Eustace Moore, MD  predniSONE (DELTASONE) 20 MG tablet Take 1 tablet (20 mg total) by mouth 2 (two) times daily with a meal. 02/08/21  Yes Eustace Moore, MD  Adalimumab 40 MG/0.4ML PNKT INJECT THE CONTENTS OF 1 PEN INTO THE SKIN EVERY 14 DAYS. DAY 22: BEGIN 40 MG MAINTENANCE DOSE EVERY OTHER WEEK. 04/29/20 04/29/21  [provider]  albuterol (VENTOLIN HFA) 108 (90 Base) MCG/ACT inhaler Inhale into the lungs. 03/13/18   [provider]  ALPRAZolam Prudy Feeler) 0.5 MG tablet TAKE 0.5-1 TABLETS 2 TIMES DAILY AS NEEDED (#90 FOR 90 DAYS!). 01/31/21   Sunnie Nielsen, DO  Fluticasone-Umeclidin-Vilant (TRELEGY ELLIPTA) 100-62.5-25 MCG/INH AEPB Inhale into the lungs. 04/17/19   [provider]  traZODone (DESYREL) 150 MG tablet TAKE 1 TABLET (150 MG TOTAL) BY MOUTH AT BEDTIME AS NEEDED FOR SLEEP. 09/13/20   Sunnie Nielsen, DO    Family History Family History  Problem Relation Age of Onset  . Psoriasis Father   . Other Father  psoriatic arthritis  . Congestive Heart Failure Mother   . Movement disorder Neg Hx     Social History Social History   Tobacco Use  . Smoking status: Former Smoker    Types: Cigarettes    Quit date: 2011    Years since quitting: 11.3  . Smokeless tobacco: Never Used  Vaping Use  . Vaping Use: Never used  Substance Use Topics  . Alcohol use: No  . Drug use: No     Allergies   Clindamycin/lincomycin, Codeine, Varenicline, and Other   Review of Systems Review of Systems See HPI  Physical Exam Triage Vital Signs ED Triage Vitals  Enc Vitals Group     BP 02/08/21 1822 (!) 152/83     Pulse Rate 02/08/21  1822 64     Resp 02/08/21 1822 20     Temp 02/08/21 1822 98.4 F (36.9 C)     Temp Source 02/08/21 1822 Oral     SpO2 02/08/21 1822 98 %     Weight 02/08/21 1817 180 lb (81.6 kg)     Height 02/08/21 1817 5\' 3"  (1.6 m)     Head Circumference --      Peak Flow --      Pain Score 02/08/21 1817 7     Pain Loc --      Pain Edu? --      Excl. in GC? --    No data found.  Updated Vital Signs BP (!) 152/83 (BP Location: Right Arm)   Pulse 64   Temp 98.4 F (36.9 C) (Oral)   Resp 20   Ht 5\' 3"  (1.6 m)   Wt 81.6 kg   SpO2 98%   BMI 31.89 kg/m  Physical Exam Constitutional:      General: She is not in acute distress.    Appearance: She is well-developed. She is obese.     Comments: In wheelchair.  Alert  HENT:     Head: Normocephalic and atraumatic.     Right Ear: Tympanic membrane normal.     Left Ear: Tympanic membrane normal.     Nose: No congestion.     Mouth/Throat:     Mouth: Mucous membranes are moist.     Pharynx: No posterior oropharyngeal erythema.  Eyes:     Conjunctiva/sclera: Conjunctivae normal.     Pupils: Pupils are equal, round, and reactive to light.  Cardiovascular:     Rate and Rhythm: Normal rate and regular rhythm.     Heart sounds: Normal heart sounds.  Pulmonary:     Effort: Pulmonary effort is normal. No respiratory distress.     Breath sounds: Wheezing and rhonchi present. No rales.     Comments: Scattered end inspiratory wheeze.  Anterior rhonchi. Abdominal:     Palpations: Abdomen is soft.  Musculoskeletal:        General: Normal range of motion.     Cervical back: Normal range of motion.  Skin:    General: Skin is warm and dry.  Neurological:     Mental Status: She is alert.  Psychiatric:        Behavior: Behavior normal.      UC Treatments / Results  Labs (all labs ordered are listed, but only abnormal results are displayed) Labs Reviewed  COVID-19, FLU A+B NAA    EKG   Radiology No results  found.  Procedures Procedures (including critical care time)  Medications Ordered in UC Medications - No data to display  Initial Impression /  Assessment and Plan / UC Course  I have reviewed the triage vital signs and the nursing notes.  Pertinent labs & imaging results that were available during my care of the patient were reviewed by me and considered in my medical decision making (see chart for details).     Family likely has a viral upper respiratory infection.  COVID and flu testing is done.  Because the patient has underlying COPD we will treat with antibiotics and prednisone.  Push fluids.  See PCP if not improving in a few days Final Clinical Impressions(s) / UC Diagnoses   Final diagnoses:  Viral URI with cough  COPD exacerbation (HCC)  Respiratory infection     Discharge Instructions     Take azithromycin as directed.  2 pills today then 1 a day for 5 days Take prednisone 2 times a day Drink plenty of fluids Continue your inhalers and nebulizer May use over-the-counter cough and cold medicines  Check MyChart for your test results   ED Prescriptions    Medication Sig Dispense Auth. Provider   predniSONE (DELTASONE) 20 MG tablet Take 1 tablet (20 mg total) by mouth 2 (two) times daily with a meal. 10 tablet Eustace Moore, MD   azithromycin (ZITHROMAX Z-PAK) 250 MG tablet Take two pills today followed by one a day until gone 6 tablet Delton See Letta Pate, MD     PDMP not reviewed this encounter.   Eustace Moore, MD 02/08/21 [phone]

## 2021-02-08 NOTE — ED Triage Notes (Signed)
Pt presents to Urgent Care with c/o cough, sore throat, and generalized weakness since yesterday. No known COVID exposure; pt vaccinated.

## 2021-03-10 ENCOUNTER — Other Ambulatory Visit: Payer: Self-pay | Admitting: Osteopathic Medicine

## 2021-04-16 ENCOUNTER — Emergency Department (INDEPENDENT_AMBULATORY_CARE_PROVIDER_SITE_OTHER)
Admission: EM | Admit: 2021-04-16 | Discharge: 2021-04-16 | Disposition: A | Discharge status: Home / Self Care | Payer: Medicare Other | Source: Home / Self Care

## 2021-04-16 ENCOUNTER — Encounter: Payer: Self-pay | Admitting: Emergency Medicine

## 2021-04-16 DIAGNOSIS — R300 Dysuria: Secondary | ICD-10-CM | POA: Diagnosis not present

## 2021-04-16 DIAGNOSIS — R059 Cough, unspecified: Secondary | ICD-10-CM

## 2021-04-16 DIAGNOSIS — N3001 Acute cystitis with hematuria: Secondary | ICD-10-CM

## 2021-04-16 LAB — POCT URINALYSIS DIP (MANUAL ENTRY)
Bilirubin, UA: NEGATIVE
Glucose, UA: NEGATIVE mg/dL
Ketones, POC UA: NEGATIVE mg/dL
Nitrite, UA: NEGATIVE
Protein Ur, POC: >=300 mg/dL — AB
Spec Grav, UA: 1.02 (ref 1.010–1.025)
Urobilinogen, UA: 0.2 E.U./dL
pH, UA: 6 (ref 5.0–8.0)

## 2021-04-16 MED ORDER — METHYLPREDNISOLONE 4 MG PO TBPK: ORAL_TABLET | ORAL | 0 refills | Status: DC

## 2021-04-16 MED ORDER — CEFDINIR 300 MG PO CAPS: 300.0000 mg | ORAL_CAPSULE | Freq: Two times a day (BID) | ORAL | 0 refills | Status: AC

## 2021-04-16 NOTE — Discharge Instructions (Addendum)
Advised patient to take medication as directed with food to completion.  Encourage patient increase daily water intake while taking these medications. 

## 2021-04-16 NOTE — ED Triage Notes (Signed)
Patient c/o urinary frequency and dysuria x 3 days.  Patient also c/o productive cough for a couple of months.  Patient does have COPD.  Patient was given an antibiotic a while ago but no better.  Patient is vaccinated for COVID.

## 2021-04-16 NOTE — ED Provider Notes (Signed)
Alexandra Banks CARE    CSN: [id number] Arrival date & time: 04/16/21  0941      History   Chief Complaint Chief Complaint  Patient presents with  . UTI/URI    HPI Alexandra Banks is a 80 y.o. female.   HPI 80 year old female presents with dysuria and frequency x3 days and cough x 3 months.  PMH significant for COPD and asthma.  Patient was treated here with Zithromax and prednisone on 02/08/2021 reports completing these medications.  Past Medical History:  Diagnosis Date  . Anxiety   . Arthritis   . Asthma   . COPD (chronic obstructive pulmonary disease) (HCC)   . Depression    pt states she doesn't take anything for this anymore   . Insomnia   . Psoriatic arthritis [facility])     Patient Active Problem List   Diagnosis Date Noted  . COPD exacerbation (HCC) 06/23/2020  . Respiratory infection 06/17/2020  . Numbness and tingling in right hand 01/22/2020  . Tardive dyskinesia 01/22/2020  . 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 Psoriatic arthritis (HCC) 02/26/2019  . 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 COPD, severe (HCC) 02/26/2017  . Status post left foot surgery 07/10/2014  . Age related osteoporosis 04/21/2013  . Spinal stenosis of lumbar region 12/12/2011  . Status post total abdominal hysterectomy and bilateral salpingo-oophorectomy 12/12/2011  . Recurrent major depressive disorder, in full remission (HCC) 12/12/2011    Past Surgical History:  Procedure Laterality Date  . ABDOMINAL HYSTERECTOMY    . BELOW KNEE LEG AMPUTATION Left 2019  . KNEE SURGERY    . TONSILLECTOMY      OB History   No obstetric history on file.      Home Medications    Prior to Admission medications   Medication Sig Start Date End Date Taking? Authorizing Provider  Adalimumab 40 MG/0.4ML PNKT INJECT THE CONTENTS OF 1 PEN INTO THE SKIN EVERY 14 DAYS. DAY 22: BEGIN 40 MG MAINTENANCE DOSE EVERY OTHER  WEEK. 04/29/20 04/29/21 Yes [provider]  albuterol (VENTOLIN HFA) 108 (90 Base) MCG/ACT inhaler Inhale into the lungs. 03/13/18  Yes [provider]  ALPRAZolam Prudy Feeler) 0.5 MG tablet TAKE 0.5-1 TABLETS 2 TIMES DAILY AS NEEDED (#90 FOR 90 DAYS!). 01/31/21  Yes Sunnie Nielsen, DO  azithromycin (ZITHROMAX Z-PAK) 250 MG tablet Take two pills today followed by one a day until gone 02/08/21  Yes Eustace Moore, MD  cefdinir (OMNICEF) 300 MG capsule Take 1 capsule (300 mg total) by mouth 2 (two) times daily for 7 days. 04/16/21 04/23/21 Yes Trevor Iha, FNP  Fluticasone-Umeclidin-Vilant (TRELEGY ELLIPTA) 100-62.5-25 MCG/INH AEPB Inhale into the lungs. 04/17/19  Yes [provider]  methylPREDNISolone (MEDROL DOSEPAK) 4 MG TBPK tablet Take as directed 04/16/21  Yes Trevor Iha, FNP  predniSONE (DELTASONE) 20 MG tablet Take 1 tablet (20 mg total) by mouth 2 (two) times daily with a meal. 02/08/21  Yes Eustace Moore, MD  traZODone (DESYREL) 150 MG tablet TAKE 1 TABLET (150 MG TOTAL) BY MOUTH AT BEDTIME AS NEEDED FOR SLEEP. 03/10/21  Yes Sunnie Nielsen, DO    Family History Family History  Problem Relation Age of Onset  . Psoriasis Father   . Other Father        psoriatic arthritis  . Congestive Heart Failure Mother   . Movement disorder Neg Hx     Social History Social History   Tobacco  Use  . Smoking status: Former    Pack years: 0.00    Types: Cigarettes    Quit date: 2011    Years since quitting: 11.4  . Smokeless tobacco: Never  Vaping Use  . Vaping Use: Never used  Substance Use Topics  . Alcohol use: No  . Drug use: No     Allergies   Clindamycin/lincomycin, Codeine, Varenicline, and Other   Review of Systems Review of Systems  Constitutional: Negative.   HENT:  Positive for congestion.   Eyes: Negative.   Respiratory:  Positive for cough.   Cardiovascular: Negative.   Gastrointestinal: Negative.   Genitourinary:  Positive for  dysuria and frequency.  Musculoskeletal: Negative.   Skin: Negative.   Neurological: Negative.     Physical Exam Triage Vital Signs ED Triage Vitals  Enc Vitals Group     BP 04/16/21 1042 140/88     Pulse Rate 04/16/21 1042 68     Resp --      Temp 04/16/21 1042 (!) 97.4 F (36.3 C)     Temp Source 04/16/21 1042 Oral     SpO2 04/16/21 1042 97 %     Weight --      Height --      Head Circumference --      Peak Flow --      Pain Score 04/16/21 1043 0     Pain Loc --      Pain Edu? --      Excl. in GC? --    No data found.  Updated Vital Signs BP 140/88 (BP Location: Right Arm)   Pulse 68   Temp (!) 97.4 F (36.3 C) (Oral)   SpO2 97%    Physical Exam Vitals and nursing note reviewed.  Constitutional:      General: She is not in acute distress.    Appearance: Normal appearance. She is normal weight. She is not ill-appearing.  HENT:     Head: Normocephalic and atraumatic.     Right Ear: Tympanic membrane and ear canal normal.     Left Ear: Tympanic membrane and ear canal normal.     Mouth/Throat:     Mouth: Mucous membranes are moist.     Pharynx: Oropharynx is clear.  Eyes:     Extraocular Movements: Extraocular movements intact.     Conjunctiva/sclera: Conjunctivae normal.     Pupils: Pupils are equal, round, and reactive to light.  Cardiovascular:     Rate and Rhythm: Normal rate and regular rhythm.     Pulses: Normal pulses.     Heart sounds: Normal heart sounds.  Pulmonary:     Effort: Pulmonary effort is normal. No respiratory distress.     Breath sounds: No stridor. Wheezing and rhonchi present. No rales.     Comments: Better diffuse rhonchi noted throughout, audible inspiratory wheezes of upper airways and bases Abdominal:     Tenderness: There is no right CVA tenderness or left CVA tenderness.  Musculoskeletal:        General: Normal range of motion.     Cervical back: Normal range of motion and neck supple. No tenderness.  Lymphadenopathy:      Cervical: No cervical adenopathy.  Skin:    General: Skin is warm and dry.  Neurological:     General: No focal deficit present.     Mental Status: She is alert and oriented to person, place, and time.  Psychiatric:        Mood and  Affect: Mood normal.        Behavior: Behavior normal.     UC Treatments / Results  Labs (all labs ordered are listed, but only abnormal results are displayed) Labs Reviewed  POCT URINALYSIS DIP (MANUAL ENTRY) - Abnormal; Notable for the following components:      Result Value   Color, UA light yellow (*)    Clarity, UA cloudy (*)    Blood, UA large (*)    Protein Ur, POC >=300 (*)    Leukocytes, UA Large (3+) (*)    All other components within normal limits  URINE CULTURE    EKG   Radiology No results found.  Procedures Procedures (including critical care time)  Medications Ordered in UC Medications - No data to display  Initial Impression / Assessment and Plan / UC Course  I have reviewed the triage vital signs and the nursing notes.  Pertinent labs & imaging results that were available during my care of the patient were reviewed by me and considered in my medical decision making (see chart for details).     MDM: 1.  Acute cystitis with hematuria, 2.  Dysuria, 3.  Cough.  Patient discharged home, hemodynamically stable. Final Clinical Impressions(s) / UC Diagnoses   Final diagnoses:  Dysuria  Acute cystitis with hematuria  Cough     Discharge Instructions      Advised patient to take medication as directed with food to completion.  Encourage patient increase daily water intake while taking these medications.     ED Prescriptions     Medication Sig Dispense Auth. Provider   cefdinir (OMNICEF) 300 MG capsule Take 1 capsule (300 mg total) by mouth 2 (two) times daily for 7 days. 14 capsule Trevor Iha, FNP   methylPREDNISolone (MEDROL DOSEPAK) 4 MG TBPK tablet Take as directed 1 each Trevor Iha, FNP      PDMP  not reviewed this encounter.   Trevor Iha, FNP 04/16/21 1217

## 2021-04-17 LAB — URINE CULTURE
MICRO NUMBER:: 12051920
SPECIMEN QUALITY:: ADEQUATE

## 2021-04-20 ENCOUNTER — Ambulatory Visit: Payer: Medicare Other | Admitting: Osteopathic Medicine

## 2021-05-01 ENCOUNTER — Other Ambulatory Visit: Payer: Self-pay | Admitting: Osteopathic Medicine

## 2021-05-02 NOTE — Telephone Encounter (Signed)
Last appt 10/07/2020  Last written 02/11/2021 #90 no refills

## 2021-05-03 NOTE — Telephone Encounter (Signed)
Please make follow up appt for patient.

## 2021-05-03 NOTE — Telephone Encounter (Signed)
From last note he is overdue No refills on controlled substances if patients do not follow up as directed   Return in about 6 months (around 04/07/2021) for [employer] WITH LABS - SEE [place] SOONER IF NEEDED.

## 2021-05-03 NOTE — Telephone Encounter (Signed)
Left voicemail message for patient to call back to get this appt scheduled. AM 

## 2021-05-20 ENCOUNTER — Telehealth: Payer: Self-pay

## 2021-05-20 NOTE — Telephone Encounter (Signed)
Transition Care Management Unsuccessful Follow-up Telephone Call  Date of discharge and from where:  05/19/2021 from [facility]   Attempts:  1st Attempt  Reason for unsuccessful TCM follow-up call:  Left voice message

## 2021-05-23 NOTE — Telephone Encounter (Signed)
Transition Care Management Unsuccessful Follow-up Telephone Call  Date of discharge and from where:  05/19/21 from [facility]  Attempts:  2nd Attempt  Reason for unsuccessful TCM follow-up call:  Left voice message

## 2021-05-25 NOTE — Telephone Encounter (Signed)
Transition Care Management Unsuccessful Follow-up Telephone Call  Date of discharge and from where:  05/19/21 from [facility]  Attempts:  3rd Attempt  Reason for unsuccessful TCM follow-up call:  Left voice message

## 2021-06-13 ENCOUNTER — Other Ambulatory Visit: Payer: Self-pay | Admitting: Osteopathic Medicine

## 2021-07-08 ENCOUNTER — Telehealth: Payer: Self-pay | Admitting: General Practice

## 2021-07-08 NOTE — Telephone Encounter (Signed)
Transition Care Management Unsuccessful Follow-up Telephone Call  Date of discharge and from where:  07/07/21 from [facility]  Attempts:  1st Attempt  Reason for unsuccessful TCM follow-up call:  Left voice message    

## 2021-07-11 NOTE — Telephone Encounter (Signed)
Transition Care Management Unsuccessful Follow-up Telephone Call  Date of discharge and from where:  07/07/21 from [facility]  Attempts:  2nd Attempt  Reason for unsuccessful TCM follow-up call:  Left voice message    

## 2021-07-13 NOTE — Telephone Encounter (Signed)
Transition Care Management Unsuccessful Follow-up Telephone Call  Date of discharge and from where:  07/07/21 from [facility]  Attempts:  3rd Attempt  Reason for unsuccessful TCM follow-up call:  Left voice message

## 2021-07-14 ENCOUNTER — Other Ambulatory Visit: Payer: Self-pay | Admitting: Osteopathic Medicine

## 2021-07-14 ENCOUNTER — Other Ambulatory Visit: Payer: Self-pay | Admitting: *Deleted

## 2021-07-15 NOTE — Telephone Encounter (Signed)
LVM letting patient know to call back to get this appointment scheduled to establish care with another provider & for any further med refills. AM

## 2021-10-26 ENCOUNTER — Other Ambulatory Visit (HOSPITAL_COMMUNITY)
Admission: RE | Admit: 2021-10-26 | Discharge: 2021-10-26 | Disposition: A | Discharge status: Home / Self Care | Payer: Medicare Other | Source: Ambulatory Visit | Attending: Family Medicine | Admitting: Family Medicine

## 2021-10-26 ENCOUNTER — Other Ambulatory Visit: Payer: Self-pay

## 2021-10-26 ENCOUNTER — Emergency Department (INDEPENDENT_AMBULATORY_CARE_PROVIDER_SITE_OTHER)
Admission: EM | Admit: 2021-10-26 | Discharge: 2021-10-26 | Disposition: A | Discharge status: Home / Self Care | Payer: Commercial Managed Care - HMO | Source: Home / Self Care

## 2021-10-26 DIAGNOSIS — N3001 Acute cystitis with hematuria: Secondary | ICD-10-CM | POA: Insufficient documentation

## 2021-10-26 HISTORY — DX: Urinary tract infection, site not specified: N39.0

## 2021-10-26 HISTORY — DX: Overactive bladder: N32.81

## 2021-10-26 LAB — POCT URINALYSIS DIP (MANUAL ENTRY)
Bilirubin, UA: NEGATIVE
Glucose, UA: NEGATIVE mg/dL
Ketones, POC UA: NEGATIVE mg/dL
Nitrite, UA: NEGATIVE
Protein Ur, POC: >=300 mg/dL — AB
Spec Grav, UA: >=1.03 — AB (ref 1.010–1.025)
Urobilinogen, UA: 0.2 E.U./dL
pH, UA: 6 (ref 5.0–8.0)

## 2021-10-26 MED ORDER — SULFAMETHOXAZOLE-TRIMETHOPRIM 800-160 MG PO TABS: 1.0000 | ORAL_TABLET | Freq: Two times a day (BID) | ORAL | 0 refills | Status: AC

## 2021-10-26 NOTE — Discharge Instructions (Addendum)
Advised patient to take medication as directed with food to completion.  Encouraged patient increase daily water intake while taking these medications.  Advised patient we will follow-up with urine culture results once received.

## 2021-10-26 NOTE — ED Provider Notes (Signed)
[name] CARE    CSN: [id number] Arrival date & time: 10/26/21  1712      History   Chief Complaint Chief Complaint  Patient presents with   Dysuria   Urinary Frequency    HPI Alexandra Banks is a 81 y.o. female.   HPI 81 year old female presents with dysuria and frequency for 1.5 weeks.  Reports history of chronic UTIs and is currently followed by urology.  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   Psoriatic arthritis ([place]) 02/26/2019   S/P BKA (below knee amputation) unilateral, left ([place]) 05/30/2018   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      Home Medications    Prior to Admission medications   Medication Sig Start Date End Date Taking? Authorizing Provider  estradiol (ESTRACE) 0.1 MG/GM vaginal cream Place vaginally. 07/27/21  Yes [provider]  sulfamethoxazole-trimethoprim (BACTRIM DS) 800-160 MG tablet Take 1 tablet by mouth 2  (two) times daily for 3 days. 10/26/21 10/29/21 Yes Eliezer Lofts, FNP  tolterodine (DETROL LA) 4 MG 24 hr capsule Take by mouth. 10/18/21  Yes [provider]  Adalimumab 40 MG/0.4ML PNKT INJECT THE CONTENTS OF 1 PEN INTO THE SKIN EVERY 14 DAYS. DAY 22: BEGIN 40 MG MAINTENANCE DOSE EVERY OTHER WEEK. 04/29/20 04/29/21  [provider]  albuterol (VENTOLIN HFA) 108 (90 Base) MCG/ACT inhaler Inhale into the lungs. 03/13/18   [provider]  ALPRAZolam Duanne Moron) 0.5 MG tablet TAKE 0.5-1 TABLETS 2 TIMES DAILY AS NEEDED (#90 FOR 90 DAYS!). 01/31/21   Emeterio Reeve, DO  Fluticasone-Umeclidin-Vilant (TRELEGY ELLIPTA) 100-62.5-25 MCG/INH AEPB Inhale into the lungs. 04/17/19   [provider]  traZODone (DESYREL) 150 MG tablet Take 1 tablet (150 mg total) by mouth at bedtime as needed for sleep. NO REFILLS. OVERDUE FOR A VISIT. 06/13/21   Emeterio Reeve, DO    Family History Family History  Problem Relation Age of Onset   Psoriasis Father    Other Father        psoriatic arthritis   Congestive Heart Failure Mother    Movement disorder Neg Hx     Social History Social History   Tobacco Use   Smoking status: Former    Types: Cigarettes    Quit date: 2011    Years since quitting: 12.0   Smokeless tobacco: Never  Vaping Use  Vaping Use: Never used  Substance Use Topics   Alcohol use: No   Drug use: No     Allergies   Clindamycin/lincomycin, Codeine, Varenicline, and Other   Review of Systems Review of Systems  Genitourinary:  Positive for dysuria, frequency and urgency.    Physical Exam Triage Vital Signs ED Triage Vitals  Enc Vitals Group     BP 10/26/21 1805 129/84     Pulse Rate 10/26/21 1805 83     Resp 10/26/21 1805 20     Temp 10/26/21 1805 98.6 F (37 C)     Temp Source 10/26/21 1805 Oral     SpO2 10/26/21 1805 97 %     Weight 10/26/21 1800 169 lb (76.7 kg)     Height 10/26/21 1800 5\' 3"  (1.6 m)     Head Circumference --      Peak  Flow --      Pain Score 10/26/21 1759 8     Pain Loc --      Pain Edu? --      Excl. in GC? --    No data found.  Updated Vital Signs BP 129/84 (BP Location: Right Arm)    Pulse 83    Temp 98.6 F (37 C) (Oral)    Resp 20    Ht 5\' 3"  (1.6 m)    Wt 169 lb (76.7 kg)    SpO2 97%    BMI 29.94 kg/m    Physical Exam Vitals and nursing note reviewed.  Constitutional:      General: She is not in acute distress.    Appearance: Normal appearance. She is obese. She is not ill-appearing.  HENT:     Mouth/Throat:     Mouth: Mucous membranes are moist.     Pharynx: Oropharynx is clear.  Eyes:     Extraocular Movements: Extraocular movements intact.     Conjunctiva/sclera: Conjunctivae normal.     Pupils: Pupils are equal, round, and reactive to light.  Cardiovascular:     Rate and Rhythm: Normal rate and regular rhythm.     Pulses: Normal pulses.     Heart sounds: Normal heart sounds.  Pulmonary:     Effort: Pulmonary effort is normal.     Breath sounds: Normal breath sounds. No wheezing, rhonchi or rales.  Abdominal:     Tenderness: There is no right CVA tenderness or left CVA tenderness.  Skin:    General: Skin is warm and dry.  Neurological:     General: No focal deficit present.     Mental Status: She is alert and oriented to person, place, and time.     UC Treatments / Results  Labs (all labs ordered are listed, but only abnormal results are displayed) Labs Reviewed  POCT URINALYSIS DIP (MANUAL ENTRY) - Abnormal; Notable for the following components:      Result Value   Clarity, UA cloudy (*)    Spec Grav, UA >=1.030 (*)    Blood, UA moderate (*)    Protein Ur, POC >=300 (*)    Leukocytes, UA Moderate (2+) (*)    All other components within normal limits  URINE CULTURE    EKG   Radiology No results found.  Procedures Procedures (including critical care time)  Medications Ordered in UC Medications - No data to display  Initial Impression / Assessment and  Plan / UC Course  I have reviewed the triage vital signs and the nursing notes.  Pertinent labs & imaging  results that were available during my care of the patient were reviewed by me and considered in my medical decision making (see chart for details).     MDM: 1.  Acute cystitis with hematuria-Rx'd Bactrim. Advised patient to take medication as directed with food to completion.  Encouraged patient increase daily water intake while taking these medications.  Advised patient we will follow-up with urine culture results once received.  Patient discharged home, hemodynamically stable.  Final Clinical Impressions(s) / UC Diagnoses   Final diagnoses:  Acute cystitis with hematuria     Discharge Instructions      Advised patient to take medication as directed with food to completion.  Encouraged patient increase daily water intake while taking these medications.  Advised patient we will follow-up with urine culture results once received.     ED Prescriptions     Medication Sig Dispense Auth. Provider   sulfamethoxazole-trimethoprim (BACTRIM DS) 800-160 MG tablet Take 1 tablet by mouth 2 (two) times daily for 3 days. 6 tablet Eliezer Lofts, FNP      PDMP not reviewed this encounter.   Eliezer Lofts, [place] 10/26/21 1846

## 2021-10-26 NOTE — ED Triage Notes (Addendum)
Pt presents to Urgent Care with c/o dysuria and frequency x 1.5 weeks. Reports hx of chronic UTIs and has been seen by a urologist.

## 2021-10-28 LAB — URINE CULTURE: Culture: <10000 — AB

## 2023-04-11 ENCOUNTER — Ambulatory Visit
Admission: EM | Admit: 2023-04-11 | Discharge: 2023-04-11 | Disposition: A | Discharge status: Home / Self Care | Payer: 59 | Attending: Family Medicine | Admitting: Family Medicine

## 2023-04-11 DIAGNOSIS — N3001 Acute cystitis with hematuria: Secondary | ICD-10-CM | POA: Insufficient documentation

## 2023-04-11 LAB — POCT URINALYSIS DIP (MANUAL ENTRY)
Bilirubin, UA: NEGATIVE
Glucose, UA: NEGATIVE mg/dL
Ketones, POC UA: NEGATIVE mg/dL
Nitrite, UA: NEGATIVE
Protein Ur, POC: 100 mg/dL — AB
Spec Grav, UA: >=1.03 — AB (ref 1.010–1.025)
Urobilinogen, UA: 0.2 E.U./dL
pH, UA: 6 (ref 5.0–8.0)

## 2023-04-11 MED ORDER — SULFAMETHOXAZOLE-TRIMETHOPRIM 800-160 MG PO TABS: 1.0000 | ORAL_TABLET | Freq: Two times a day (BID) | ORAL | 0 refills | Status: AC

## 2023-04-11 NOTE — Discharge Instructions (Addendum)
Advised patient to take medication as directed with food to completion.  Encouraged to increase daily water intake to 64 ounces per day while taking this medication.  Advised we will follow-up with urine culture results once received..  Advised if symptoms worsen and/or unresolved please follow-up with PCP, urology or here for further evaluation.

## 2023-04-11 NOTE — ED Triage Notes (Signed)
Pt c/o burning with urination x 2 days 

## 2023-04-11 NOTE — ED Provider Notes (Signed)
[facility]    CSN: [id number] Arrival date & time: 04/11/23  0858      History   Chief Complaint Chief Complaint  Patient presents with   Dysuria    HPI Alexandra Banks is a 82 y.o. female.   HPI Pleasant 82 year old female presents with dysuria for 2 days. PMH significant for chronic UTI, COPD, and overactive bladder.  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   Psoriatic arthritis (HCC) 02/26/2019   S/P BKA (below knee amputation) unilateral, left (HCC) 05/30/2018   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      Home Medications    Prior to Admission medications   Medication Sig Start Date End Date Taking? Authorizing Provider  sulfamethoxazole-trimethoprim (BACTRIM DS) 800-160 MG tablet Take 1 tablet by mouth 2 (two) times daily for 7 days. 04/11/23 04/18/23 Yes Trevor Iha, FNP  Adalimumab 40 MG/0.4ML PNKT INJECT THE CONTENTS OF 1 PEN INTO THE  SKIN EVERY 14 DAYS. DAY 22: BEGIN 40 MG MAINTENANCE DOSE EVERY OTHER WEEK. 04/29/20 04/29/21  [provider]  albuterol (VENTOLIN HFA) 108 (90 Base) MCG/ACT inhaler Inhale into the lungs. 03/13/18   [provider]  ALPRAZolam Prudy Feeler) 0.5 MG tablet TAKE 0.5-1 TABLETS 2 TIMES DAILY AS NEEDED (#90 FOR 90 DAYS!). 01/31/21   Sunnie Nielsen, DO  enoxaparin (LOVENOX) 100 MG/ML injection Inject into the skin.    [provider]  estradiol (ESTRACE) 0.1 MG/GM vaginal cream Place vaginally. 07/27/21   [provider]  Fluticasone-Umeclidin-Vilant (TRELEGY ELLIPTA) 100-62.5-25 MCG/INH AEPB Inhale into the lungs. 04/17/19   [provider]  tolterodine (DETROL LA) 4 MG 24 hr capsule Take by mouth. 10/18/21   [provider]  traZODone (DESYREL) 150 MG tablet Take 1 tablet (150 mg total) by mouth at bedtime as needed for sleep. NO REFILLS. OVERDUE FOR A VISIT. 06/13/21   Sunnie Nielsen, DO    Family History Family History  Problem Relation Age of Onset   Psoriasis Father    Other Father        psoriatic arthritis   Congestive Heart Failure Mother    Movement disorder Neg Hx     Social History Social History   Tobacco Use   Smoking status: Former    Types: Cigarettes    Quit date: 2011    Years since quitting: 13.4  Smokeless tobacco: Never  Vaping Use   Vaping Use: Never used  Substance Use Topics   Alcohol use: No   Drug use: No     Allergies   Clindamycin/lincomycin, Codeine, Varenicline, and Other   Review of Systems Review of Systems   Physical Exam Triage Vital Signs ED Triage Vitals  Enc Vitals Group     BP      Pulse      Resp      Temp      Temp src      SpO2      Weight      Height      Head Circumference      Peak Flow      Pain Score      Pain Loc      Pain Edu?      Excl. in GC?    No data found.  Updated Vital Signs BP 123/81 (BP Location: Right Arm)   Pulse 69   Temp 98 F (36.7 C) (Oral)    Resp 18   SpO2 95%    Physical Exam Vitals and nursing note reviewed.  Constitutional:      Appearance: Normal appearance. She is obese.  HENT:     Head: Normocephalic and atraumatic.     Mouth/Throat:     Mouth: Mucous membranes are moist.     Pharynx: Oropharynx is clear.  Eyes:     Extraocular Movements: Extraocular movements intact.     Conjunctiva/sclera: Conjunctivae normal.     Pupils: Pupils are equal, round, and reactive to light.  Cardiovascular:     Rate and Rhythm: Normal rate and regular rhythm.  Pulmonary:     Effort: Pulmonary effort is normal.     Breath sounds: Normal breath sounds. No wheezing, rhonchi or rales.  Abdominal:     Tenderness: There is no right CVA tenderness or left CVA tenderness.  Musculoskeletal:        General: Normal range of motion.     Cervical back: Normal range of motion and neck supple.  Skin:    General: Skin is warm and dry.  Neurological:     General: No focal deficit present.     Mental Status: She is alert and oriented to person, place, and time. Mental status is at baseline.  Psychiatric:        Mood and Affect: Mood normal.        Behavior: Behavior normal.      UC Treatments / Results  Labs (all labs ordered are listed, but only abnormal results are displayed) Labs Reviewed  POCT URINALYSIS DIP (MANUAL ENTRY) - Abnormal; Notable for the following components:      Result Value   Spec Grav, UA >=1.030 (*)    Blood, UA trace-intact (*)    Protein Ur, POC =100 (*)    Leukocytes, UA Small (1+) (*)    All other components within normal limits  URINE CULTURE    EKG   Radiology No results found.  Procedures Procedures (including critical care time)  Medications Ordered in UC Medications - No data to display  Initial Impression / Assessment and Plan / UC Course  I have reviewed the triage vital signs and the nursing notes.  Pertinent labs & imaging results that were available during my care of the patient  were reviewed by me and considered in my medical decision making (see chart for details).     MDM: 1.  Acute cystitis  with hematuria-UA revealed above, urine culture ordered, Rx'd Bactrim DS 800/160 mg tablet twice daily x 7 days. Advised patient to take medication as directed with food to completion.  Encouraged to increase daily water intake to 64 ounces per day while taking this medication.  Advised we will follow-up with urine culture results once received..  Advised if symptoms worsen and/or unresolved please follow-up with PCP, urology or here for further evaluation.  Patient discharged home, hemodynamically stable.  Final Clinical Impressions(s) / UC Diagnoses   Final diagnoses:  Acute cystitis with hematuria     Discharge Instructions      Advised patient to take medication as directed with food to completion.  Encouraged to increase daily water intake to 64 ounces per day while taking this medication.  Advised we will follow-up with urine culture results once received..  Advised if symptoms worsen and/or unresolved please follow-up with PCP, urology or here for further evaluation.     ED Prescriptions     Medication Sig Dispense Auth. Provider   sulfamethoxazole-trimethoprim (BACTRIM DS) 800-160 MG tablet Take 1 tablet by mouth 2 (two) times daily for 7 days. 14 tablet Trevor Iha, FNP      PDMP not reviewed this encounter.   Trevor Iha, FNP 04/11/23 [phone]

## 2023-04-12 LAB — URINE CULTURE

## 2023-11-30 ENCOUNTER — Other Ambulatory Visit: Payer: Self-pay
